# Patient Record
Sex: Female | Born: 1975 | Hispanic: No | Marital: Married | Smoking: Never smoker
Health system: Southern US, Community
[De-identification: ages and names within clinical notes are randomized; demographics above are authoritative.]

## PROBLEM LIST (undated history)

## (undated) DIAGNOSIS — I10 Essential (primary) hypertension: Secondary | ICD-10-CM

## (undated) DIAGNOSIS — N926 Irregular menstruation, unspecified: Secondary | ICD-10-CM

## (undated) DIAGNOSIS — E785 Hyperlipidemia, unspecified: Secondary | ICD-10-CM

## (undated) DIAGNOSIS — Z87898 Personal history of other specified conditions: Secondary | ICD-10-CM

## (undated) HISTORY — DX: Irregular menstruation, unspecified: N92.6

---

## 2009-12-25 ENCOUNTER — Encounter: Payer: Self-pay | Admitting: Family Medicine

## 2009-12-25 ENCOUNTER — Ambulatory Visit: Payer: Self-pay | Admitting: Family Medicine

## 2009-12-25 LAB — CONVERTED CEMR LAB
Antibody Screen: NEGATIVE
Basophils Absolute: 0 10*3/uL (ref 0.0–0.1)
Eosinophils Relative: 2 % (ref 0–5)
HCT: 34.3 % — ABNORMAL LOW (ref 36.0–46.0)
Lymphocytes Relative: 22 % (ref 12–46)
Neutro Abs: 8.6 10*3/uL — ABNORMAL HIGH (ref 1.7–7.7)
Neutrophils Relative %: 71 % (ref 43–77)
Platelets: 310 10*3/uL (ref 150–400)
RDW: 16 % — ABNORMAL HIGH (ref 11.5–15.5)
Rh Type: POSITIVE
Rubella: 345.3 intl units/mL — ABNORMAL HIGH
Sickle Cell Screen: NEGATIVE

## 2010-01-01 ENCOUNTER — Ambulatory Visit: Payer: Self-pay | Admitting: Family Medicine

## 2010-01-01 ENCOUNTER — Encounter: Payer: Self-pay | Admitting: Family Medicine

## 2010-01-04 ENCOUNTER — Encounter: Payer: Self-pay | Admitting: Family Medicine

## 2010-01-04 ENCOUNTER — Ambulatory Visit (HOSPITAL_COMMUNITY): Admission: RE | Admit: 2010-01-04 | Discharge: 2010-01-04 | Payer: Self-pay | Admitting: Family Medicine

## 2010-01-21 ENCOUNTER — Encounter: Payer: Self-pay | Admitting: Family Medicine

## 2010-01-21 DIAGNOSIS — E669 Obesity, unspecified: Secondary | ICD-10-CM

## 2010-01-21 DIAGNOSIS — O34219 Maternal care for unspecified type scar from previous cesarean delivery: Secondary | ICD-10-CM | POA: Insufficient documentation

## 2010-01-24 ENCOUNTER — Ambulatory Visit: Payer: Self-pay | Admitting: Family Medicine

## 2010-02-01 ENCOUNTER — Ambulatory Visit: Payer: Self-pay | Admitting: Family Medicine

## 2010-02-01 ENCOUNTER — Encounter: Payer: Self-pay | Admitting: Family Medicine

## 2010-02-05 ENCOUNTER — Encounter: Payer: Self-pay | Admitting: Family Medicine

## 2010-02-14 ENCOUNTER — Encounter (INDEPENDENT_AMBULATORY_CARE_PROVIDER_SITE_OTHER): Payer: Self-pay | Admitting: *Deleted

## 2010-02-14 ENCOUNTER — Encounter: Payer: Self-pay | Admitting: Family Medicine

## 2010-02-14 ENCOUNTER — Ambulatory Visit: Payer: Self-pay | Admitting: Family Medicine

## 2010-02-26 ENCOUNTER — Encounter: Payer: Self-pay | Admitting: Family Medicine

## 2010-02-26 ENCOUNTER — Ambulatory Visit: Payer: Self-pay | Admitting: Family Medicine

## 2010-02-26 LAB — CONVERTED CEMR LAB
Hemoglobin: 10.7 g/dL — ABNORMAL LOW (ref 12.0–15.0)
MCHC: 32.1 g/dL (ref 30.0–36.0)
Platelets: 245 10*3/uL (ref 150–400)
RDW: 16.3 % — ABNORMAL HIGH (ref 11.5–15.5)

## 2010-03-12 ENCOUNTER — Ambulatory Visit: Payer: Self-pay | Admitting: Family Medicine

## 2010-03-27 ENCOUNTER — Ambulatory Visit: Payer: Self-pay | Admitting: Family Medicine

## 2010-04-09 ENCOUNTER — Encounter: Payer: Self-pay | Admitting: Family Medicine

## 2010-04-09 ENCOUNTER — Ambulatory Visit: Payer: Self-pay | Admitting: Family Medicine

## 2010-04-09 LAB — CONVERTED CEMR LAB: GC Probe Amp, Genital: NEGATIVE

## 2010-04-11 ENCOUNTER — Encounter: Payer: Self-pay | Admitting: Family Medicine

## 2010-04-18 ENCOUNTER — Ambulatory Visit: Payer: Self-pay | Admitting: Family Medicine

## 2010-04-22 ENCOUNTER — Ambulatory Visit: Payer: Self-pay | Admitting: Family Medicine

## 2010-04-22 ENCOUNTER — Telehealth (INDEPENDENT_AMBULATORY_CARE_PROVIDER_SITE_OTHER): Payer: Self-pay | Admitting: *Deleted

## 2010-04-24 ENCOUNTER — Encounter: Payer: Self-pay | Admitting: Family Medicine

## 2010-05-03 ENCOUNTER — Ambulatory Visit: Payer: Self-pay | Admitting: Family Medicine

## 2010-05-07 ENCOUNTER — Ambulatory Visit: Payer: Self-pay | Admitting: Family Medicine

## 2010-05-10 ENCOUNTER — Encounter: Payer: Self-pay | Admitting: Family Medicine

## 2010-05-10 ENCOUNTER — Ambulatory Visit: Payer: Self-pay | Admitting: Obstetrics and Gynecology

## 2010-05-13 ENCOUNTER — Ambulatory Visit: Payer: Self-pay | Admitting: Family Medicine

## 2010-05-13 ENCOUNTER — Inpatient Hospital Stay (HOSPITAL_COMMUNITY): Admission: RE | Admit: 2010-05-13 | Discharge: 2010-05-16 | Payer: Self-pay | Admitting: Obstetrics & Gynecology

## 2010-05-13 ENCOUNTER — Ambulatory Visit: Payer: Self-pay | Admitting: Obstetrics and Gynecology

## 2010-05-23 ENCOUNTER — Encounter: Payer: Self-pay | Admitting: Emergency Medicine

## 2010-05-23 ENCOUNTER — Inpatient Hospital Stay (HOSPITAL_COMMUNITY): Admission: AD | Admit: 2010-05-23 | Discharge: 2010-05-27 | Payer: Self-pay | Admitting: Obstetrics and Gynecology

## 2010-05-23 ENCOUNTER — Ambulatory Visit: Payer: Self-pay | Admitting: Obstetrics and Gynecology

## 2010-06-05 ENCOUNTER — Ambulatory Visit: Payer: Self-pay | Admitting: Family Medicine

## 2010-06-05 DIAGNOSIS — O2390 Unspecified genitourinary tract infection in pregnancy, unspecified trimester: Secondary | ICD-10-CM | POA: Insufficient documentation

## 2010-06-05 DIAGNOSIS — O91019 Infection of nipple associated with pregnancy, unspecified trimester: Secondary | ICD-10-CM | POA: Insufficient documentation

## 2010-06-05 DIAGNOSIS — O91219 Nonpurulent mastitis associated with pregnancy, unspecified trimester: Secondary | ICD-10-CM | POA: Insufficient documentation

## 2010-06-05 DIAGNOSIS — N719 Inflammatory disease of uterus, unspecified: Secondary | ICD-10-CM | POA: Insufficient documentation

## 2010-06-26 ENCOUNTER — Ambulatory Visit: Payer: Self-pay | Admitting: Family Medicine

## 2010-06-26 ENCOUNTER — Encounter: Payer: Self-pay | Admitting: Family Medicine

## 2011-01-28 NOTE — Assessment & Plan Note (Signed)
Summary: IUD placement at Post Partum Visit   Vital Signs:  Patient profile:   35 year old female Weight:      242.1 pounds Temp:     97.7 degrees F oral Pulse rate:   71 / minute Pulse rhythm:   regular BP sitting:   114 / 76  (left arm) Cuff size:   large  Vitals Entered By: Loralee Pacas CMA (June 26, 2010 4:50 PM)  Primary Care Provider:  Jamie Brookes MD   History of Present Illness: Pregnancy complications: no Delivery type: VBAC Vaginal bleeding: resolved shortly after delivery with no further problems, endometritis after delivery, in hosptial for 6 days. See prior visit.  Menses: no menstration Contraception: IUD Vaginal discharge: none Abdominal pain: no Fever/Chills: no Feeding: breast feeding Sleep:she is sleeping about 6 hours a night Mood: normal  Return to school/work: homekeeping Bowel Movements: regular and normal   Pt also here for IUD placement. Consent signed. Dr. Jennette Kettle assisted.    Current Medications (verified): 1)  Prenatal Vitamins Otc .... Take One Daily 2)  Calna  Tabs (Prenatal Vitamins) .Marland Kitchen.. 1 By Mouth Daily 3)  Cleocin 150 Mg Caps (Clindamycin Hcl) .... Take 1 Pill Every 6 Hours For 2 Days. 4)  Cleocin 300 Mg Caps (Clindamycin Hcl) .... Take 1 Pill Every 6 Hours For 2 Days.  Allergies (verified): No Known Drug Allergies  Review of Systems        vitals reviewed and pertinent negatives and positives seen in HPI   Physical Exam  General:  Well-developed,well-nourished,in no acute distress; alert,appropriate and cooperative throughout examination Abdomen:  soft, obese, difficult to feel uterus Genitalia:  normal vagina and cervix.  Psych:  Cognition and judgment appear intact. Alert and cooperative with normal attention span and concentration. No apparent delusions, illusions, hallucinations   Impression & Recommendations:  Problem # 1:  CONTRACEPTIVE MANAGEMENT (ICD-V25.09) Assessment New Pt had IUD placed in uterus during exam.  Her cervix was slightly stenotic and since she has had endometritis (over 5 weeks ago) after discussing with Dr. Jennette Kettle it was decided to treat prophylaxically for 48 hours. Given Clindamycin as treatment. Pt tolerated procedure well. States that she felt the strings.   Orders: [facility] ([address]) Postpartum visit- [facility] ([address])  Problem # 2:  POSTPARTUM EXAMINATION (ICD-V24.2) Assessment: New Pt has a normal post partum exam. She is doing well physically and psychologically.   [facility]  Complete Medication List: 1)  Prenatal Vitamins Otc  .... Take one daily 2)  Calna Tabs (Prenatal vitamins) .Marland Kitchen.. 1 by mouth daily 3)  Cleocin 150 Mg Caps (Clindamycin hcl) .... Take 1 pill every 6 hours for 2 days. 4)  Cleocin 300 Mg Caps (Clindamycin hcl) .... Take 1 pill every 6 hours for 2 days.  Patient Instructions: 1)  Feel for your strings every month after your period.  2)  If you can not feel the strings at any time, make an appointment to be seen.  3)  You can resume sex in 1 week.  4)  You will need a pap smear in late January.  5)  Call if you have any other concerns.  Prescriptions: CLEOCIN 300 MG CAPS (CLINDAMYCIN HCL) take 1 pill every 6 hours for 2 days.  #8 x 0   Entered and Authorized by:   Jamie Brookes MD   Signed by:   Jamie Brookes MD on 06/26/2010   Method used:   Electronically to  [facility] [address] [phone]* (retail)       [address]       [place], [place]  [address]       Ph: [phone]       Fax: [phone]   RxID:   [id number] CLEOCIN 150 MG CAPS (CLINDAMYCIN HCL) take 1 pill every 6 hours for 2 days.  #8 x 0   Entered and Authorized by:   Jamie Brookes MD   Signed by:   Jamie Brookes MD on 06/26/2010   Method used:   Electronically to        [facility]. [phone]* (retail)       [address]       [place], [place]  [address]       Ph: [phone]       Fax:  [phone]   RxID:   [id number]    Laboratory Results   Urine Tests  Date/Time Received: June 26, 2010 4:53 PM  Date/Time Reported: June 26, 2010 5:10 PM     Urine HCG: negative Comments: ...............test performed by......Marland KitchenBonnie A. [place], MLS (ASCP)cm     Appended Document: IUD placement at Post Partum Visit

## 2011-01-28 NOTE — Letter (Signed)
Summary: Generic Letter  [facility]  [address]   [place], [place] [address]   Phone: [phone]  Fax: [phone]    02/14/2010  ELAINNA ESHLEMAN [address] [place], [place]  [address]   Dear Maricela Curet of Ms. Francene Finders,  Mrs. Losano is my obstetrics patient and due to her having a c-section in the past there is a chance that she will require another c-section. It would be greatly appreciated for you to join her as she prepares to deliver her 4th child and she will need help taking care of the many children in her household.           Sincerely,   Visual merchandiser MD  STATE OF [facility] OF _________________ On this__________day of ____________, personally appeared before me the named ______________, to me known and known to me to be the person described in and who executed the forgoing instrument and he/she acknowledges that he/she executed the same and being duly sworn by me, made oath that the statements in the foregoing instrument are true.  _______________ Notary Public   My Commission Expires:_______________ (OFFICIAL SEAL)

## 2011-01-28 NOTE — Miscellaneous (Signed)
Summary: Consent: IUD Insertion  Consent: IUD Insertion   Imported By: Knox Royalty 11/28/2010 10:48:12  _____________________________________________________________________  External Attachment:    Type:   Image     Comment:   External Document

## 2011-01-28 NOTE — Assessment & Plan Note (Signed)
Summary: 34.2 OB, [id number], EDD 05-06-10   Vital Signs:  Patient profile:   35 year old female Height:      56.75 inches Weight:      250.8 pounds BMI:     54.95 Temp:     97.8 degrees F oral Pulse rate:   81 / minute BP sitting:   108 / 71  (left arm) Cuff size:   large  Vitals Entered By: Gladstone Pih (March 27, 2010 4:26 PM) CC: OB 34  2/7 Is Patient Diabetic? No Pain Assessment Patient in pain? no        Primary Care Provider:  Jamie Brookes MD  CC:  OB 34  2/7.  History of Present Illness: Pt is doing well. She is not having any contractions, trace edema and is still eating well. She comes in with her brother who is her interpretor as usual.   Habits & Providers  Alcohol-Tobacco-Diet     Tobacco Status: never     Cigarette Packs/Day: n/a  Current Medications (verified): 1)  Prenatal Vitamins Otc .... Take One Daily  Allergies (verified): No Known Drug Allergies  Social History: Smoking Status:  never  Review of Systems        vitals reviewed and pertinent negatives and positives seen in HPI   Physical Exam  General:  Well-developed,well-nourished,in no acute distress; alert,appropriate and cooperative throughout examination Abdomen:  normal pregnant abdomen. Baby's head is seen as down on [place] today at bedside.    Impression & Recommendations:  Problem # 1:  PREGNANT STATE, INCIDENTAL (ICD-V22.2) Assessment Unchanged Pt is doing well. Plan for GBS testing at next visit. Will have meeting with Dr. Bailey Mech in April to talk about c-section/birth plans.   Orders: Other OB visit- [facility] ([facility]K)  Complete Medication List: 1)  Prenatal Vitamins Otc  .... Take one daily  Patient Instructions: 1)  Remember to do the kick counts if you don't feel the baby kicking. 2)  You are doing well. 3)  Keep walking and eating healthy. 4)  I hope your sister is able to come in time.  5)  I will see you in 2 weeks and we will test for GBS next visit.     Flowsheet  View for Follow-up Visit    Estimated weeks of       gestation:     34 2/7    Weight:     250.8    Blood pressure:   108 / 71    Headache:     few    Nausea/vomiting:   No    Edema:     1+LE    Vaginal bleeding:   no    Vaginal discharge:   no    Fundal height:      34    FHR:       136    Fetal activity:     yes    Labor symptoms:   no    Fetal position:     vertex    Taking prenatal vits?   Y    Smoking:     n/a    Next visit:     2 wk    Resident:     Clotilde Dieter    Preceptor:     Pearletha Forge

## 2011-01-28 NOTE — Assessment & Plan Note (Signed)
Summary: endometritis, left mastitis   Vital Signs:  Patient profile:   35 year old female Weight:      240.5 pounds Temp:     97.6 degrees F oral Pulse rate:   65 / minute Pulse rhythm:   regular BP sitting:   114 / 75  (left arm) Cuff size:   large  Vitals Entered By: Loralee Pacas CMA (June 05, 2010 8:56 AM) CC: ob post-partum hospitalization check.  Is Patient Diabetic? No   Primary Care Provider:  Jamie Brookes MD  CC:  ob post-partum hospitalization check. .  History of Present Illness: PT comes in today for a hospital follow up visit. She is accompanied by her husband. He is her inturpretor. She was discharged from the hosptial on a Friday and after going home she developed a post-partum infection. She began having chills at home despite having multiple blankets over her. She came to [facility] and was transferred to [facility] with endometritis and left sided breast mastitiswhere she spent 5 days on IV antibiotics and a total 6 (5/26-5/30) days in the hospital including ED visit and transfer. She is doing well now and has had a little bit of mastitis on the left side of the breast. She says it is producing less milk but is not painful anymore. The baby is sleeping better now and she is getting a little more rest. She still wants to have the IUD placed at the next visit. She is not having any lochia now. No abdominal pain.   Habits & Providers  Alcohol-Tobacco-Diet     Tobacco Status: never  Current Medications (verified): 1)  Prenatal Vitamins Otc .... Take One Daily 2)  Calna  Tabs (Prenatal Vitamins) .Marland Kitchen.. 1 By Mouth Daily  Allergies (verified): No Known Drug Allergies  Review of Systems        vitals reviewed and pertinent negatives and positives seen in HPI   Physical Exam  General:  Well-developed,well-nourished,in no acute distress; alert,appropriate and cooperative throughout examination Lungs:  Normal respiratory effort, chest expands  symmetrically. Lungs are clear to auscultation, no crackles or wheezes. Heart:  Normal rate and regular rhythm. S1 and S2 normal without gallop, murmur, click, rub or other extra sounds. Abdomen:  Bowel sounds positive,abdomen soft and non-tender without masses, organomegaly or hernias noted. fundus not felt to be enlarged.  Psych:  Cognition and judgment appear intact. Alert and cooperative with normal attention span and concentration. No apparent delusions, illusions, hallucinations   Impression & Recommendations:  Problem # 1:  ENDOMETRITIS (ICD-615.9) Assessment Improved Pt is doing much better. She has no fever, no pain and no more bleeding. She had 5 days of IV Abx and is completely better.   Orders: [facility]- Est Level  3 ([address])  Problem # 2:  MASTITIS, LACTATING (ICD-675.20) Assessment: Improved Pt has less milk supply on the left breast but it is not painful anymore. Her mastitis has resolved.   Orders: [facility]- Est Level  3 ([address])  Complete Medication List: 1)  Prenatal Vitamins Otc  .... Take one daily 2)  Calna Tabs (Prenatal vitamins) .Marland Kitchen.. 1 by mouth daily  Patient Instructions: 1)  Come on June 29th at the 4:30 slot (ask the front desk to make this appointment: ok to double book).  2)  Take a Ibuprofen 30 mintues before your appointment to prevent cramps.     Flowsheet View for Follow-up Visit    Weight:     240.5    Blood  pressure:   114 / 75

## 2011-01-28 NOTE — Assessment & Plan Note (Signed)
Summary: OB 37 weeks   Vital Signs:  Patient profile:   35 year old female Weight:      257 pounds Pulse rate:   77 / minute BP sitting:   128 / 76  Vitals Entered By: Jone Baseman CMA (April 18, 2010 8:36 AM)  Habits & Providers  Alcohol-Tobacco-Diet     Cigarette Packs/Day: n/a  Allergies: No Known Drug Allergies   Impression & Recommendations:  Problem # 1:  PREGNANT STATE, INCIDENTAL (ICD-V22.2)  Doing well. Pain consistent with ligament laxity of pregnancy.  Tylenol ok to take.  Limited support now and when baby comes.  Reviewed kick counts and labor precautions.  Refill PNV.  Orders: Other OB visit- [facility] ([facility]K)  Problem # 2:  PREVIOUS C-SECT DELIVERY ANTPRTM COND/COMP (ZOX-096.04) Pt initially wanted repeat section, but realized the recovery while caring for 7 children will be hard. I  discussed with Dr. Jolayne Panther, who requests that pt be seen again for counseling.  Appt for 4/26 at 10:30.    Complete Medication List: 1)  Prenatal Vitamins Otc  .... Take one daily 2)  Calna Tabs (Prenatal vitamins) .Marland Kitchen.. 1 by mouth daily  Patient Instructions: 1)  You may take Tylenol (acetaminophen) 500 mg every 6 hours as needed for pain. 2)  Please go to [facility] or call us if you have regular contractions, watery discharge, if the baby is not moving well, or if you have vaginal bleeding. 3)  We will call the OB doctors and see if we can change things to try for a vaginal delivery.  Prescriptions: CALNA  TABS (PRENATAL VITAMINS) 1 by mouth daily  #100 x 3   Entered and Authorized by:   Sarah Swaziland MD   Signed by:   Sarah Swaziland MD on 04/18/2010   Method used:   Electronically to        [facility] [address] [phone]* (retail)       [address] [place], [place]  [address]       Ph: [phone]       Fax: [phone]   RxID:   [id number] PRENATAL VITAMINS OTC take one daily  #0 x 0   Entered and Authorized by:   Sarah [place]aziland MD   Signed by:    Sarah Swaziland MD on 04/18/2010   Method used:   Print then Give to Patient   RxID:   [id number]   Prenatal Visit Concerns noted: Would like to try for vaginal delivery.  Also with lots of pain in bottom, goes around to back.  Not like contractions.  Worse with walking, turning over. No meds for it.  Pt takes care of her 3 other children and her brother-in-law's 3 children (ages 68, 4.5,3.5).  Husband goes to work part-time.  Limited support for when baby comes.  Hope to have sister come from [place], but she still has not been given an appt by the embassy to apply for visa, so unclear if she will be able to come.  Husband helps in morning.     Flowsheet View for Follow-up Visit    Estimated weeks of       gestation:     37 3/7    Weight:     257    Blood pressure:   128 / 76    Headache:     few    Nausea/vomiting:   No    Edema:     TrLE  Vaginal bleeding:   no    Vaginal discharge:   d/c    Fundal height:      37    FHR:       140s    Fetal activity:     yes    Labor symptoms:   no    Fetal position:     vertex    Taking prenatal vits?   Y    Smoking:     n/a    Next visit:     1 wk

## 2011-01-28 NOTE — Letter (Signed)
Summary: Generic Letter: Letter to sister  [facility]  [address]   [place], [place] [address]   Phone: [phone]  Fax: [phone]    02/01/2010  AMARRA SAWYER [address] [place], [place]  [address]  Dear Maricela Curet of Ms. Francene Finders,  Mrs. Armstead is my obstetrics patient and due to her having a c-section in the past there is a chance that she will require another c-section. It would be greatly appreciated for you to join her as she prepares to deliver her 4th child.    Sincerely,   Jamie Brookes MD

## 2011-01-28 NOTE — Assessment & Plan Note (Signed)
Summary: 32.1 weeks,    Vital Signs:  Patient profile:   35 year old female Height:      56.75 inches Weight:      246.7 pounds Temp:     98.7 degrees F oral Pulse rate:   83 / minute BP sitting:   114 / 73  (left arm) Cuff size:   large  Vitals Entered By: Gladstone Pih (March 12, 2010 2:30 PM) CC: OB 32.1 wks, normal labs,  Is Patient Diabetic? No Pain Assessment Patient in pain? no        Primary Care Provider:  Jamie Brookes MD  CC:  OB 32.1 wks, normal labs, and .  History of Present Illness: Pt is doing well, she had one episode of sharp belly pain but it only lasted minutes and she has not had any more. She had no blood or fluid leakage. The baby is moving a lot. She is having some swelling in her lower legs that often goes away by morning but sometimes is still barely there.   Habits & Providers  Alcohol-Tobacco-Diet     Cigarette Packs/Day: n/a  Current Medications (verified): 1)  Prenatal Vitamins Otc .... Take One Daily  Allergies (verified): No Known Drug Allergies  Social History: lives with her husband and her brother, takes care of her own 3 children and her brothers 3 children.  Brother is interpretor  Review of Systems        vitals reviewed and pertinent negatives and positives seen in HPI   Physical Exam  General:  Well-developed,well-nourished,in no acute distress; alert,appropriate and cooperative throughout examination Extremities:  trace edema on feet   Impression & Recommendations:  Problem # 1:  PREGNANT STATE, INCIDENTAL (ICD-V22.2)  Pt is doing well. SHe has an appointment set up with Dr. Ladean Raya in April. Plan to see her back in 2 weeks. GBS at 36 weeks.  Orders: Other OB visit- [facility] ([facility]BCK)  Complete Medication List: 1)  Prenatal Vitamins Otc  .... Take one daily  Patient Instructions: 1)  It was nice to see you today.  2)  You are 32.[redacted] weeks along.  3)  If you have any problems with bleeding, contractions or fluid  [place] call the doctor line or go to the [facility]. 4)  If you don't feel the baby kicking for a while during the day, lay down for an hour and count the baby kicks. There should be 5 kicks in an hour. If you don't have 5 kicks in 1 hour drink a glass of cold water and lay down again and keep counting. You should have 10 kicks in 2 hours. If not, go to [facility] hospital.  5)  I will see you in 2 weeks.     Flowsheet View for Follow-up Visit    Estimated weeks of       gestation:     32 1/7    Weight:     246.7    Blood pressure:   114 / 73    Headache:     few    Nausea/vomiting:   nausea    Edema:     TrLE    Vaginal bleeding:   no    Vaginal discharge:   no    Fundal height:      32    FHR:       1050's    Fetal activity:     yes    Labor symptoms:   few ctx  Fetal position:     ??    Taking prenatal vits?   Y    Smoking:     n/a    Next visit:     2 wk    Resident:     Clotilde Dieter    Preceptor:     Leveda Anna

## 2011-01-28 NOTE — Assessment & Plan Note (Signed)
Summary: 40.1 OB/KH   Vital Signs:  Patient profile:   35 year old female Weight:      259.4 pounds Temp:     97.4 degrees F oral Pulse rate:   75 / minute Pulse rhythm:   regular BP sitting:   122 / 77  (left arm) Cuff size:   large  Vitals Entered By: Loralee Pacas CMA (May 07, 2010 9:16 AM)  Primary Care Provider:  Jamie Brookes MD  CC:  40.1 OB.  History of Present Illness: PT is doing well.   Habits & Providers  Alcohol-Tobacco-Diet     Cigarette Packs/Day: n/a  Allergies: No Known Drug Allergies   Impression & Recommendations:  Problem # 1:  PREGNANT STATE, INCIDENTAL (ICD-V22.2) Assessment Unchanged Pt is 40.1. She has an NST/BPP set up for this Friday, then induction is set up for Monday if she does not go into labor before that time. Pt has my pager number. I will see them at the induction if not before.   Orders: Other OB visit- [facility] (OBCK)  Complete Medication List: 1)  Prenatal Vitamins Otc  .... Take one daily 2)  Calna Tabs (Prenatal vitamins) .Marland Kitchen.. 1 by mouth daily    Flowsheet View for Follow-up Visit    Estimated weeks of       gestation:     40 1/7    Weight:     259.4    Blood pressure:   122 / 77    Headache:     few    Nausea/vomiting:   No    Edema:     1+LE    Vaginal bleeding:   no    Vaginal discharge:   d/c    Fundal height:      40    FHR:       145    Fetal activity:     yes    Labor symptoms:   no    Fetal position:     vertex    Taking prenatal vits?   Y    Smoking:     n/a    Next visit:     see you at induction if not before    Resident:     Licensed conveyancer:     [place]

## 2011-01-28 NOTE — Assessment & Plan Note (Signed)
Summary: 39.4, G3P3013, GBS neg, EDD 05-06-10   Vital Signs:  Patient profile:   35 year old female Weight:      259.6 pounds Temp:     98.3 degrees F oral Pulse rate:   72 / minute Pulse rhythm:   regular BP sitting:   125 / 78  (left arm) Cuff size:   large  Vitals Entered By: Loralee Pacas CMA (May 03, 2010 9:04 AM) CC: 39.4 OB   Primary Care Provider:  Jamie Brookes MD  CC:  39.4 OB.  History of Present Illness: Pt has gone over to [facility] and signed VBAC papers. She comes with her husband and kids today. They take their children to Dr. Netta Cedars at [facility]. This baby will f/u with him as well. We will set her up for an NST/BPP, appointment with me and induction for 1 week from her due date.   Habits & Providers  Alcohol-Tobacco-Diet     Cigarette Packs/Day: n/a  Current Medications (verified): 1)  Prenatal Vitamins Otc .... Take One Daily 2)  Calna  Tabs (Prenatal Vitamins) .Marland Kitchen.. 1 By Mouth Daily  Allergies (verified): No Known Drug Allergies  Review of Systems        vitals reviewed and pertinent negatives and positives seen in HPI    Impression & Recommendations:  Problem # 1:  PREGNANT STATE, INCIDENTAL (ICD-V22.2) Assessment Unchanged Pt wants a VBAC. She will have 7 children to watch after and will need to get back home. Does not want to spend 3 days in the hosptial.  Next appointment with me Tuesday, May 10th at 9:00am NST/BPP next Friday, May 13th at 10:00 am Induction for Monday, May 16th at 7:30 pm   Orders: Other OB visit- [facility] (OBCK)  Complete Medication List: 1)  Prenatal Vitamins Otc  .... Take one daily 2)  Calna Tabs (Prenatal vitamins) .Marland Kitchen.. 1 by mouth daily  Patient Instructions: 1)  pt given paper with dates of appointments set up for her.     Flowsheet View for Follow-up Visit    Estimated weeks of       gestation:     39 4/7    Weight:     259.6    Blood pressure:   125 / 78    Headache:     few    Nausea/vomiting:   No  Edema:     TrLE    Vaginal bleeding:   no    Vaginal discharge:   d/c    Fundal height:      38    FHR:       143    Fetal activity:     yes    Labor symptoms:   no    Fetal position:     vertex    Taking prenatal vits?   Y    Smoking:     n/a    Next visit:     1 wk    Resident:     Jw Covin    Preceptor:     breen

## 2011-01-28 NOTE — Assessment & Plan Note (Signed)
Summary: NOB/AAM/DSL   Vital Signs:  Patient profile:   35 year old female LMP:     07/30/2009 Height:      56.75 inches Weight:      235 pounds BMI:     51.49 BSA:     1.92 Pulse rate:   81 / minute BP sitting:   119 / 72  Vitals Entered By: Jone Baseman CMA (January 01, 2010 1:58 PM) CC: NOB LMP (date): 07/30/2009 EDC by LMP==> 05/06/2010 EDC 05/06/2010 LMP - Character: normal LMP - Reliable? Yes Menarche (age onset years): 12   Menses interval (days): 28 Menstrual flow (days): 4 On BCP's at conception: no Date of + home preg. test: 09/02/2009 Enter LMP: 07/30/2009   CC:  NOB.  Habits & Providers  Alcohol-Tobacco-Diet     Cigarette Packs/Day: brother smokes outside  Social History: Education:  highschool Hepatitis Risk:  no Packs/Day:  brother smokes outside   Impression & Recommendations:  Problem # 1:  PREGNANT STATE, INCIDENTAL (ICD-V22.2) Assessment New This is the patients first OB visit. We set up first [place] for this friday At 1:45pm. She will need 1 hr GTT at next visit. I have referred her to [facility] clinic to discuss VBAC.  She is 22.1 wks, [id number], EDD 05-06-10  Orders: Prenatal U/S > 14 weeks - 48185 (Prenatal U/S) GC/Chlamydia-[facility] (87591/87491) Obstetric Referral (Obstetric) Other OB visit- [facility] (OBCK)  Other Orders: Pap Smear-[facility] ([id number])  Patient Instructions: 1)  You have an ultrasound scheduled for 1:45 pm this Friday. 2)  You are doing well. The babies heart rate in 140 3)  At your next visit you will have to stay an hour to have a glucose test.  4)  We will set up an appointment for you to meet with the doctors at [facility] to talk to them about a vaginal birth or another C-section.  5)  Make an appointment to see me in 1 month.   Prenatal Visit    FOB name: Eliane Decree [facility] Confirmation:    New working [facility]: 05/06/2010    LMP reliable? Yes    Last menses onset (LMP) date: 07/30/2009    EDC by LMP:  05/06/2010   Flowsheet View for Follow-up Visit    Estimated weeks of       gestation:     22 1/7    Weight:     235    Blood pressure:   119 / 72    Headache:     No    Nausea/vomiting:   No    Edema:     0    Vaginal bleeding:   no    Vaginal discharge:   no    Fundal height:      18    FHR:       140    Fetal activity:     yes    Labor symptoms:   no    Fetal position:     ??    Taking prenatal vits?   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 : Yes    Menarche: 12 years    Menses interval: 28 days    Menstrual flow 4 days    On BCP's at conception: no    Date of positive (+) home preg. test: 09/02/2009    Pre Pregnancy Weight: 235 lbs.    Symptoms since LMP: amenorrhea, fatigue, irritability, bloating, tender breasts, urinary frequency  Prenatal Visit    FOB name: Eliane Decree [facility] Confirmation:    New working [facility]: 05/06/2010    LMP reliable? Yes    Last menses onset (LMP) date: 07/30/2009    EDC by LMP: 05/06/2010   Past Pregnancy History    Gravida:     4    Term Births:     3    Premature Births:   0    Living Children:   3    Para:       3    Mult. Births:     1    Prev C-Section:   1    Aborta:     1    Elect. Ab:     0     Spont. Ab:     1    Ectopics:     0  Pregnancy # 1    Delivery date:     10/13/2006    Weeks Gestation:   40    Preterm labor:     no    Delivery type:     NSVD    Hours of labor:     9    Anesthesia type:     epidural    Delivery location:     [place]    Infant Sex:     Female    Birth weight:     6 lbs    Name:     Arees  Pregnancy # 2    Delivery date:     12/08/2007    Weeks Gestation:   37    Preterm labor:     no    Delivery type:     C-section    Hours of labor:     1    Anesthesia type:     spinal    Delivery location:     [place]    Infant Sex:     Female    Name:     Ninfa Linden    Comments:     twins #1 5lb #2  5lb 10 oz  Pregnancy # 3    Delivery date:     03/29/2005    Comments:     spontaneous Ab at 8 wks  Pregnancy # 4    Comments:     current   Genetic History     Thalassemia:     mother: no    Neural tube defect:   mother: no    Down's Syndrome:  mother: no    Tay-Sachs:     mother: no    Sickle Cell Dz/Trait:   mother: no    Hemophilia:     mother: no    Muscular Dystrophy:   mother: no    Cystic Fibrosis:   mother: no    Huntington's Dz:   mother: no    Mental Retardation:   mother: no    Fragile X:     mother: no    Other Genetic or       Chromosomal Dz:   mother: yes       comments: brother had ear defect    Child with other       birth defect:     mother: no    > 3 spont. abortions:   mother: no    Hx of stillbirth:     mother: yes  Infection Risk History    High Risk Hepatitis B: no    Immunized against Hepatitis B: no    Exposure to TB: no    Patient with history of Genital Herpes: no    Sexual partner with history of Genital Herpes: no    History of STD (GC, Chlamydia, Syphilis, HPV): no    Rash, Viral, or Febrile Illness since LMP: no    Exposure to Cat Litter: no    Chicken Pox Immune Status: Hx of Disease: Immune    History of Parvovirus (Fifth Disease): no    Occupational Exposure to Children: none  Environmental Exposures     Xray Exposure since LMP: no    Chemical or other exposure: no    Medication, drug, or alcohol use since LMP: no   Flowsheet View for Follow-up Visit    Estimated weeks of       gestation:     22 1/7    Weight:     235    Blood pressure:   119 / 72    Hx headache?     No    Nausea/vomiting?   No    Edema?     0    Bleeding?     no    Leakage/discharge?   no    Fetal activity:       yes    Labor symptoms?   no    Fundal height:      18    FHR:       140    Fetal position:      ??    Taking Vitamins?   Y    Smoking PPD:   brother smokes outside    Comment:     anatomy US ordered    Next visit:     4 wk    Resident:     Clotilde Dieter    Preceptor:     Chambliss      Appended Document: NOB/AAM/DSL Pt needs 1 hour GTT before next visit.  Will clarify "h/o stillbirth" in genetics screen - likely refers to 1st trimester SAB in past.  Due for flu shot.

## 2011-01-28 NOTE — Miscellaneous (Signed)
Summary: glucola order  Clinical Lists Changes  Problems: Added new problem of OBESITY, UNSPECIFIED (ICD-278.00) Added new problem of PREVIOUS C-SECT DELIVERY ANTPRTM COND/COMP (BJY-782.95) Orders: Added new Test order of Glucose 1 hr-[facility] (62130) - Signed

## 2011-01-28 NOTE — Consult Note (Signed)
Summary: [facility]   Imported By: De Nurse 06/05/2010 10:46:39  _____________________________________________________________________  External Attachment:    Type:   Image     Comment:   External Document

## 2011-01-28 NOTE — Assessment & Plan Note (Signed)
Summary:  28.3  OB, EDD 05-06-10, female fetus, normal 1 hr GTT   Vital Signs:  Patient profile:   35 year old female Height:      56.75 inches Weight:      238.9 pounds BMI:     52.34 Pulse rate:   87 / minute BP sitting:   107 / 69  (left arm) Cuff size:   regular  Vitals Entered By: Gladstone Pih (February 14, 2010 10:26 AM) CC: OB 28  3/7 Is Patient Diabetic? No Pain Assessment Patient in pain? no        Primary Care Provider:  Jamie Brookes MD  CC:  OB 28  3/7.  History of Present Illness: Pt comes in with her brother who translates for her. They are from [place]. Pt is 28.3 today and is doing well. She is having a 1 hr GTT today. We will set up a visit with [facility] to discuss VBAC at next visit.   Habits & Providers  Alcohol-Tobacco-Diet     Cigarette Packs/Day: n/a  Allergies: No Known Drug Allergies  Social History: lives with her husband and her brother, takes care of her own 3 children and her brothers 3 children.   Review of Systems        vitals reviewed and pertinent negatives and positives seen in HPI   Physical Exam  General:  Well-developed,well-nourished,in no acute distress; alert,appropriate and cooperative throughout examination Abdomen:  Bowel sounds positive,abdomen soft and non-tender pregnant abdomen   Impression & Recommendations:  Problem # 1:  PREGNANT STATE, INCIDENTAL (ICD-V22.2) Assessment Unchanged Pt is doing well today. This will be her 4th child. She had a normal 1 hr GTT today. Will plan to set up VBAC discussion with OB surgeons a next appt. RTC in 2 weeks.   Orders: Glucose 1 hr-[facility] ([id number]) Other OB visit- [facility] (OBCK)  Patient Instructions: 1)  I will see you in 2 weeks.  2)  The baby is doing well.     Flowsheet View for Follow-up Visit    Estimated weeks of       gestation:     28 3/7    Weight:     238.9    Blood pressure:   107 / 69    Headache:     few    Nausea/vomiting:   No    Edema:     0    Vaginal  bleeding:   no    Vaginal discharge:   no    Fundal height:      28    FHR:       150's    Fetal activity:     yes    Labor symptoms:   no    Fetal position:     ??    Taking prenatal vits?   Y    Smoking:     n/a    Next visit:     2 wk    Resident:     Clotilde Dieter

## 2011-01-28 NOTE — Miscellaneous (Signed)
Summary: Re : C Section scheduled for 04-29-10 @ 1pm  Clinical Lists Changes   received notice  from  [facility] that patient has been scheduled for C section for 04/29/2010. patient is aware  of date according to brother who translates for her.Theresia Lo RN  April 11, 2010 4:50 PM   I need to know the time for the c-section so I can try to attend. She is my continuity delivery. Jamie Brookes MD  April 12, 2010 7:39 AM   the paper we received states 1:00 PM on 04/29/2010 Theresia Lo RN  April 12, 2010 8:37 AM

## 2011-01-28 NOTE — Assessment & Plan Note (Signed)
Summary: 38.0 wks  OB/KH   Vital Signs:  Patient profile:   35 year old female Weight:      255.9 pounds Temp:     98.1 degrees F oral Pulse rate:   73 / minute Pulse rhythm:   regular BP sitting:   112 / 75  (left arm) Cuff size:   large  Vitals Entered By: Loralee Pacas CMA (April 22, 2010 9:12 AM)  Primary Care Provider:  Jamie Brookes MD   History of Present Illness: Pt has decided to do a VBAC instead of a c-section because she takes care of 6 (soon to be 7) children at home and can not plan to be down for the recovery time it takes for c-section. She has had he counseling with Dr. Ladean Raya and can quote he 1% risk of rupture statistic. Plan to have patient sign VBAC consent either here or at the [facility].   Habits & Providers  Alcohol-Tobacco-Diet     Cigarette Packs/Day: n/a  Current Medications (verified): 1)  Prenatal Vitamins Otc .... Take One Daily 2)  Calna  Tabs (Prenatal Vitamins) .Marland Kitchen.. 1 By Mouth Daily  Allergies (verified): No Known Drug Allergies  Family History: comes from large family (3 brothers, 7 sisters), from [place], [place]  Review of Systems        vitals reviewed and pertinent negatives and positives seen in HPI    Impression & Recommendations:  Problem # 1:  PREGNANT STATE, INCIDENTAL (ICD-V22.2) Pt has decided she wants a VBAC. She needs to sign consent papers to have a VBAC. She has already had the counseling she needs. PT decided to change it because she has too many children to take care of at hom and does not have time to be out for recovery from a c-section.   Orders: Other OB visit- [facility] (OBCK)  Complete Medication List: 1)  Prenatal Vitamins Otc  .... Take one daily 2)  Calna Tabs (Prenatal vitamins) .Marland Kitchen.. 1 by mouth daily  Patient Instructions: 1)  It was good to see you today.  2)  I will call you later about signing the consent form.  3)  If you are having abdominal pain or contractions, bleeding or a leakage of fluid go to  [facility].     [facility] for Follow-up Visit    Estimated weeks of       gestation:     38 0/7    Weight:     255.9    Blood pressure:   112 / 75    Headache:     few    Nausea/vomiting:   No    Edema:     TrLE    Vaginal bleeding:   no    Vaginal discharge:   d/c    Fundal height:      38    FHR:       140's    Fetal activity:     yes    Labor symptoms:   no    Fetal position:     vertex    Taking prenatal vits?   Y    Smoking:     n/a    Next visit:     1 wk    Resident:     Avary Eichenberger    Preceptor:     Jennette Kettle

## 2011-01-28 NOTE — Progress Notes (Signed)
Summary: Going to [facility] to sign VBAC papers  ---- Converted from flag ---- ---- 04/18/2010 10:02 AM, Sarah [place] MD wrote: Could you call pt and let her know that [facility] wanted to see her again to talk about the trial of labor?  She has an appt on Tuesday, April 26 at 10:30.  Thanks! SJ ------------------------------  Pt husband contacted. He can take her at that time.  Rescheduled for Wed 4/27 at 9:00 to sign paper for VBAC. Husband notified and understands. Starleen Blue RN 04/22/2010

## 2011-01-28 NOTE — Assessment & Plan Note (Signed)
Summary: 36.1 wk, OB, GBS done, send handout when GBS back   Vital Signs:  Patient profile:   35 year old female Height:      56.75 inches Weight:      252.5 pounds BMI:     55.32 Temp:     98.1 degrees F oral Pulse rate:   90 / minute BP sitting:   116 / 74  (left arm) Cuff size:   large  Vitals Entered By: Gladstone Pih (April 09, 2010 4:26 PM) CC: OB 36 1/7 Is Patient Diabetic? No Pain Assessment Patient in pain? no        Primary Care Provider:  Jamie Brookes MD  CC:  OB 36 1/7.  History of Present Illness: Pt comes in today with her husband and 3 children. She is doing well but feels like the baby has dropped. She is having some occasional pain in her pelvic region. She is not having any signs of labor.   Habits & Providers  Alcohol-Tobacco-Diet     Tobacco Status: never     Cigarette Packs/Day: n/a  Current Medications (verified): 1)  Prenatal Vitamins Otc .... Take One Daily  Allergies (verified): No Known Drug Allergies  Social History: lives with her husband and her brother, takes care of her own 3 children and her brothers 3 children.  Brother is interpretor but husband speaks english as well. Husband does not currently have a job so brother pays for most everything.   Review of Systems        vitals reviewed and pertinent negatives and positives seen in HPI   Physical Exam  General:  Well-developed,well-nourished,in no acute distress; alert,appropriate and cooperative throughout examination Genitalia:  Normal introitus for age, no external lesions, no vaginal discharge, mucosa pink and moist, no vaginal or cervical lesions, no vaginal atrophy, no friaility or hemorrhage, normal uterus size and position, no adnexal masses or tenderness Psych:  Cognition and judgment appear intact. Alert and cooperative with normal attention span and concentration. No apparent delusions, illusions, hallucinations   Impression & Recommendations:  Problem # 1:   PREGNANT STATE, INCIDENTAL (ICD-V22.2) Pt is doing well. She was advised of precautions of when to come to the [facility]. She has an appointment with an OB MD tomorrow at the [facility]. She missed her appointment today. She will discuss options with them since she has had a c-section in the past.  Pt given my pager number to call me if she is getting admitted for labor. She will be seen in the [facility] OB clinic next week and then seen by me weekly until the baby is born. Will f/u on GBS and send report to [facility] when GBS is back.   Orders: GC/Chlamydia-[facility] ([id number]) Grp B Probe-[facility] ([id number]) Other OB visit- [facility] (OBCK)  Complete Medication List: 1)  Prenatal Vitamins Otc  .... Take one daily  Patient Instructions: 1)  You are doing really well. 2)  Remember if you are having contractions every 5 minutes, bleeding, rush of fluid, or pain go to [facility]. 3)  If you are getting admitted for labor have them page me at [phone].  4)  Please make an appointment with our OB clinic (to see one of our head doctors) and an appointment to see me every week from now until you have the baby.     Flowsheet View for Follow-up Visit    Estimated weeks of       gestation:     36 1/7  Weight:     252.5    Blood pressure:   116 / 74    Headache:     few    Nausea/vomiting:   No    Edema:     0    Vaginal bleeding:   no    Vaginal discharge:   d/c    Fundal height:      35    FHR:       145    Fetal activity:     yes    Labor symptoms:   no    Fetal position:     vertex    Taking prenatal vits?   Y    Smoking:     n/a    Next visit:     1 wk    Resident:     Clotilde Dieter    Preceptor:     chambliss

## 2011-01-28 NOTE — Miscellaneous (Signed)
Summary: C section?   Call [facility] at 36 wks to schedule appt.  Clinical Lists Changes [place] from [facility] called [phone]) asking [place] to call her about this pt's repeat C section. I called & left a message for her to call back.Golden Circle RN  February 05, 2010 11:08 AM   they rec'd a fax from [facility] asking about a consultation with a md there about a repeat C section.[place] states we do not need to fax anything, just call her at [phone]. they usually will schedule this when pt is at 36 weeks. asked that we call them when she is this far along .Marland KitchenGolden Circle RN  February 05, 2010 11:37 AM   spoke with [place] and she states she cannot schedule at this time , just plan to call back when patient is 36 weeks and they will be able to get her in within the week. will notifiy MD. Theresia Lo RN  February 05, 2010 1:53 PM   noted. Thank you. Jamie Brookes MD  February 07, 2010 11:44 AM

## 2011-01-28 NOTE — Assessment & Plan Note (Signed)
Summary: 30.1 wks ob, check position with portable US    Vital Signs:  Patient profile:   35 year old female Weight:      246.8 pounds Temp:     97.7 degrees F Pulse rate:   88 / minute BP sitting:   120 / 74  Vitals Entered By: Loralee Pacas CMA (February 26, 2010 2:06 PM)  Primary Care Provider:  Jamie Brookes MD  CC:  30.1 wk OB.  History of Present Illness: comes in today with her brother who interprets for her. She is having some occasional headaches and low back pain. She has been reluctant to take tylenol. Encouraged it's use. Pt is having a little swelling in her feet. She says it is not bad today.   Habits & Providers  Alcohol-Tobacco-Diet     Cigarette Packs/Day: n/a  Current Medications (verified): 1)  None  Allergies (verified): No Known Drug Allergies  Family History: comes from large family (3 brothers, 7 sisters)  Review of Systems        vitals reviewed and pertinent negatives and positives seen in HPI   Physical Exam  Extremities:  trace left pedal edema.     Impression & Recommendations:  Problem # 1:  PREGNANT STATE, INCIDENTAL (ICD-V22.2) Assessment Unchanged Pt is doing well. Discussed where she is going to send the baby to healthcare. She takes her other kids to [facility] (see Dr. Hyacinth Meeker) she plans to take this baby there.  Some swelling. BP 120/74. Will monitor. Unknown position, will check with bedside US next time to check position.  Orders: CBC-[facility] ([id number]) HIV-[facility] ([id number]) RPR-[facility] ([id number]) Other OB visit- [facility])  Patient Instructions: 1)  You are doing well. 2)  Use the Tylenol or warm towels to treat your back pain and headaches.  3)  You can prop your feet up at night to keep the swelling down. 4)  Your appointment to meet with the Ob surgeons at [facility] clinic is April 12 at 10 am.  5)  You will have some required labs done today. We can discuss them at your next visit in 2 week.      Flowsheet  View for Follow-up Visit    Estimated weeks of       gestation:     30 1/7    Weight:     246.8    Blood pressure:   120 / 74    Hx headache?     few    Nausea/vomiting?   No    Edema?     TrLE    Bleeding?     no    Leakage/discharge?   d/c    Fetal activity:       yes    Labor symptoms?   few ctx    Fundal height:      31    FHR:       140    Fetal position:      ??    Taking Vitamins?   Y    Smoking PPD:   n/a    Comment:     large body habitus: difficult to tell position    Next visit:     2 wk    Resident:     Clotilde Dieter    Preceptor:     Deirdre Priest

## 2011-01-28 NOTE — Assessment & Plan Note (Signed)
Summary: 26.4 wks ob/ EDD 05-06-10, Female fetus, needs 28 wks labs next vi   Vital Signs:  Patient profile:   35 year old female Weight:      240 pounds BP sitting:   110 / 66  Vitals Entered By: Arlyss Repress CMA, (February 01, 2010 9:46 AM)  Primary Care Provider:  Jamie Brookes MD  CC:  26.4 wk OB visit.  History of Present Illness: Pt is doing well. She comes in with her husband who acts as her interpretor.   Habits & Providers  Alcohol-Tobacco-Diet     Cigarette Packs/Day: n/a  Current Medications (verified): 1)  None  Allergies (verified): No Known Drug Allergies  Social History: Packs/Day:  n/a  Physical Exam  General:  Well-developed,well-nourished,in no acute distress; alert,appropriate and cooperative throughout examination Lungs:  Normal respiratory effort, chest expands symmetrically. Lungs are clear to auscultation, no crackles or wheezes. Heart:  Normal rate and regular rhythm. S1 and S2 normal without gallop, murmur, click, rub or other extra sounds.   Impression & Recommendations:  Problem # 1:  PREGNANT STATE, INCIDENTAL (ICD-V22.2) Assessment Unchanged Pt is doing well. Has occaional headaches. Pt advised to use Tylenol for headaches. Discussed birth control options with her. She will think about them. EDD 05-06-10. Pt will come back in 2 weeks and will get 28 wk labs at that time. Pt recieved flu shot today.  Pt still has not seen [facility]'s about VBAC. Resent flag as this was ordered on 01-01-10. Gave pt info about calling [facility] and talking to [employer] about their bill.   Orders: Other OB visit- [facility])  Other Orders: Obstetric Referral (Obstetric) Influenza Vaccine NON MCR ([address])  Patient Instructions: 1)  Talk to [employer] and [facility] about your hosptial bill to see what you are responsible for.  2)  Make an appointment for 2 weeks. You will get blood work done at that visit. 3)  You are 26 weeks and 4 days today.    4)  You got a flu shot today.    OB Initial Intake Information    Positive HCG by: self    Race: Other    Marital status: Married    Occupation: homemaker    Education (last grade completed): highschool    Number of children at home: 3    [facility]    Newborn's physician: Kemara Quigley [phone]  FOB Information    Husband/Father of baby: Eliane Decree    FOB occupation unemployed    Phone: [phone]    FOB Comments: Call Brother First:  Brother [phone]  Menstrual History    LMP (date): 07/30/2009    LMP - Character: normal    Menarche: 12 years    Menses interval: 28 days    Menstrual flow 4 days    On BCP's at conception: no    Date of positive (+) home preg. test: 09/02/2009   Flowsheet View for Follow-up Visit    Estimated weeks of       gestation:     26 4/7    Weight:     240    Blood pressure:   110 / 66    Headache:     No    Nausea/vomiting:   nausea    Edema:     0    Vaginal bleeding:   no    Vaginal discharge:   no    Fundal height:  27    FHR:       140's    Fetal activity:     yes    Labor symptoms:   no    Fetal position:     ??    Taking prenatal vits?   Y    Smoking:     n/a    Next visit:     2 wk    Resident:     Clotilde Dieter    Preceptor:     Sheffield Slider   Influenza Vaccine    Vaccine Type: Fluvax Non-MCR    Site: right deltoid    Mfr: GlaxoSmithKline    Dose: 0.5 ml    Route: IM    Given by: Arlyss Repress CMA,    Exp. Date: 06/27/2010    Lot #: [id number]    VIS given: 07/22/07 version given February 01, 2010.  Flu Vaccine Consent Questions    Do you have a history of severe allergic reactions to this vaccine? no    Any prior history of allergic reactions to egg and/or gelatin? no    Do you have a sensitivity to the preservative Thimersol? no    Do you have a past history of Guillan-Barre Syndrome? no    Do you currently have an acute febrile illness? no    Have you ever had a severe reaction to latex?  no    Vaccine information given and explained to patient? yes    Are you currently pregnant? yes

## 2011-01-28 NOTE — Miscellaneous (Signed)
Summary: appt at [facility] on 04/09/10 @ 10am with Dr. Ladean Raya  Clinical Lists Changes Lindsey Cunningham from [facility] wants pt to be told at her next ob visit that her appt to discuss birth plan is 04/09/10 at 10am with Dr. Ladean Raya. to pcp .Marland KitchenGolden Circle RN  February 14, 2010 2:24 PM   noted. Jamie Brookes MD  February 18, 2010 7:06 PM

## 2011-03-17 LAB — DIFFERENTIAL
Basophils Absolute: 0 10*3/uL (ref 0.0–0.1)
Basophils Absolute: 0 10*3/uL (ref 0.0–0.1)
Basophils Relative: 0 % (ref 0–1)
Eosinophils Absolute: 0 10*3/uL (ref 0.0–0.7)
Eosinophils Absolute: 0 10*3/uL (ref 0.0–0.7)
Eosinophils Relative: 0 % (ref 0–5)
Lymphocytes Relative: 14 % (ref 12–46)
Lymphs Abs: 1.2 10*3/uL (ref 0.7–4.0)
Neutro Abs: 7.3 10*3/uL (ref 1.7–7.7)

## 2011-03-17 LAB — CBC
Hemoglobin: 11.5 g/dL — ABNORMAL LOW (ref 12.0–15.0)
MCHC: 32.8 g/dL (ref 30.0–36.0)
MCV: 73.8 fL — ABNORMAL LOW (ref 78.0–100.0)
Platelets: 217 10*3/uL (ref 150–400)
RBC: 4.77 MIL/uL (ref 3.87–5.11)
WBC: 15.8 10*3/uL — ABNORMAL HIGH (ref 4.0–10.5)
WBC: 15.5 10*3/uL — ABNORMAL HIGH (ref 4.0–10.5)
WBC: 8.9 10*3/uL (ref 4.0–10.5)

## 2011-03-17 LAB — CULTURE, BLOOD (ROUTINE X 2): Culture: NO GROWTH

## 2011-03-17 LAB — URINE MICROSCOPIC-ADD ON

## 2011-03-17 LAB — URINALYSIS, ROUTINE W REFLEX MICROSCOPIC
Bilirubin Urine: NEGATIVE
Glucose, UA: NEGATIVE mg/dL
Glucose, UA: NEGATIVE mg/dL
Ketones, ur: NEGATIVE mg/dL
Ketones, ur: NEGATIVE mg/dL
Nitrite: NEGATIVE
Urobilinogen, UA: 0.2 mg/dL (ref 0.0–1.0)
pH: 6 (ref 5.0–8.0)
pH: 6 (ref 5.0–8.0)

## 2011-03-17 LAB — RPR: RPR Ser Ql: NONREACTIVE

## 2011-03-17 LAB — COMPREHENSIVE METABOLIC PANEL
Albumin: 3 g/dL — ABNORMAL LOW (ref 3.5–5.2)
Alkaline Phosphatase: 60 U/L (ref 39–117)
BUN: 11 mg/dL (ref 6–23)
CO2: 24 mEq/L (ref 19–32)
Calcium: 8.3 mg/dL — ABNORMAL LOW (ref 8.4–10.5)
Chloride: 105 mEq/L (ref 96–112)
Creatinine, Ser: 0.82 mg/dL (ref 0.4–1.2)
GFR calc Af Amer: >60 mL/min (ref >60)
Glucose, Bld: 103 mg/dL — ABNORMAL HIGH (ref 70–99)
Potassium: 3.7 mEq/L (ref 3.5–5.1)
Total Protein: 6.7 g/dL (ref 6.0–8.3)

## 2011-03-17 LAB — GC/CHLAMYDIA PROBE AMP, GENITAL
Chlamydia, DNA Probe: NEGATIVE
GC Probe Amp, Genital: NEGATIVE

## 2011-03-17 LAB — URINE CULTURE
Colony Count: NO GROWTH
Culture: NO GROWTH

## 2012-03-04 IMAGING — US US TRANSVAGINAL NON-OB
1 series · 13 of 25 positions shown · non-contrast
Comparison: Prior ultrasound of pregnancy performed 01/04/2010

CLINICAL DATA: Postpartum fever; leukocytosis.  Diffuse pelvic
pain.

TRANSABDOMINAL AND TRANSVAGINAL ULTRASOUND OF PELVIS
TECHNIQUE: Both transabdominal and transvaginal ultrasound
examinations of the pelvis were performed including evaluation of
the uterus, ovaries, adnexal regions, and pelvic cul-de-sac.

[Series 1: us transvaginal non-ob · 0.31mm/px · 44 acquisitions, 13 frames shown]
[im 1/44]
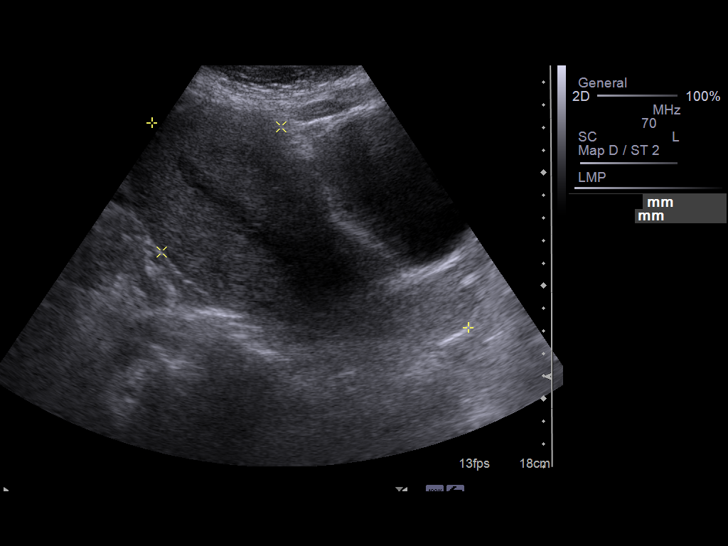
[im 4/44]
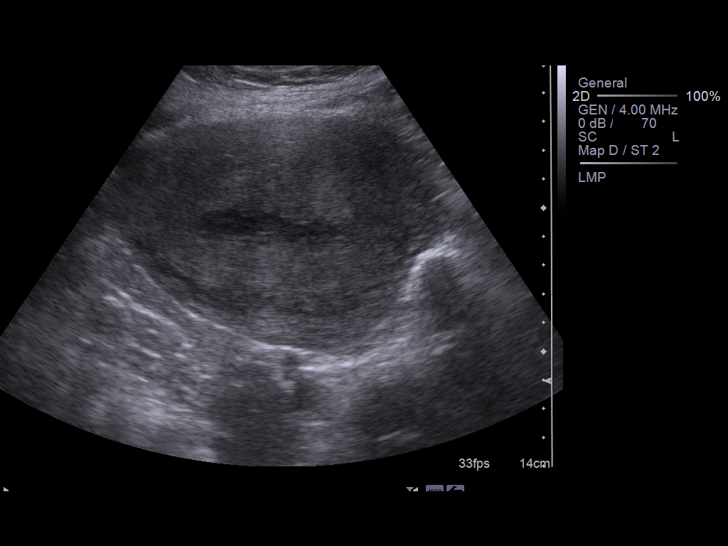
[im 8/44]
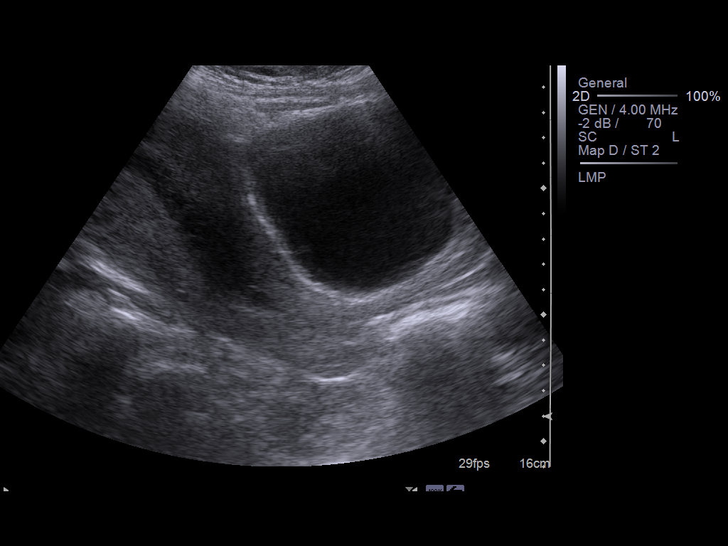
[im 11/44]
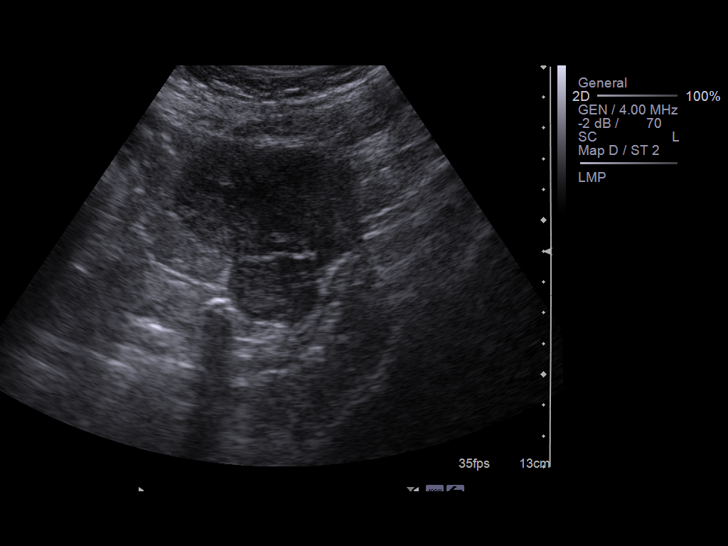
[im 15/44]
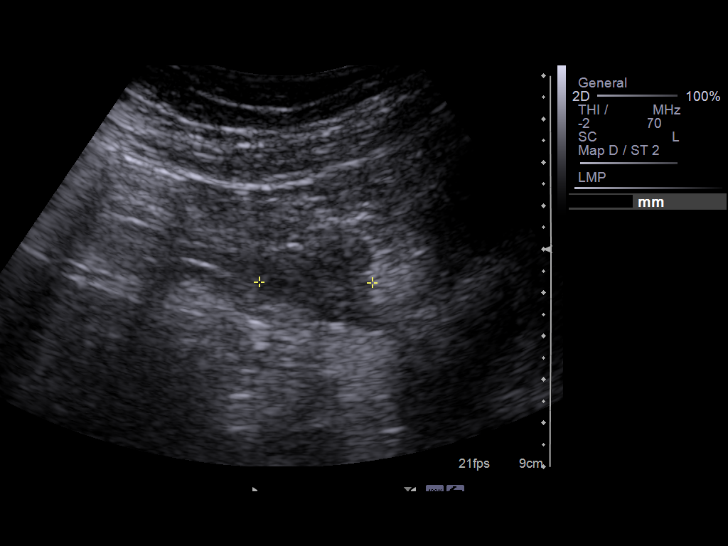
[im 18/44]
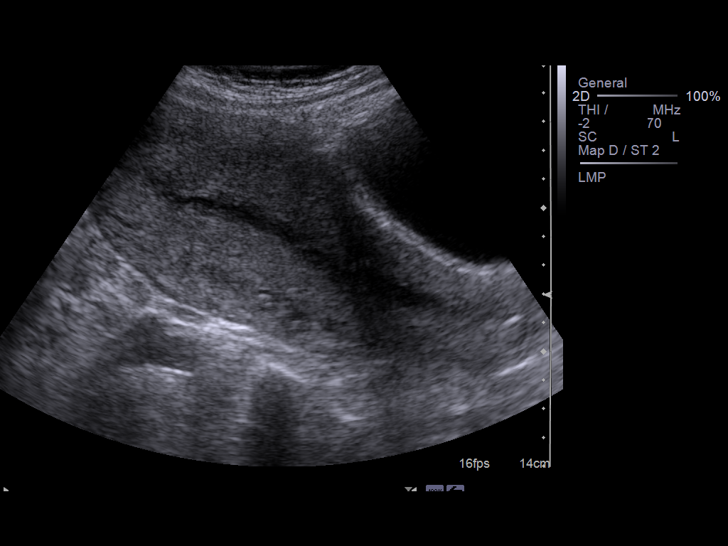
[im 22/44]
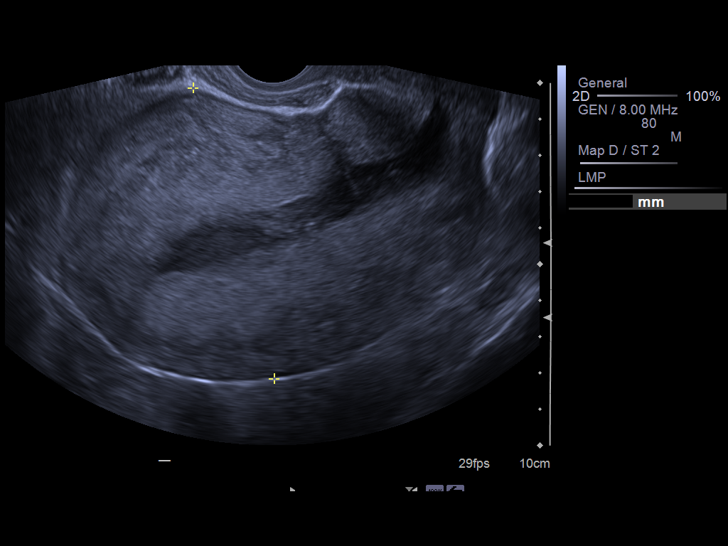
[im 26/44]
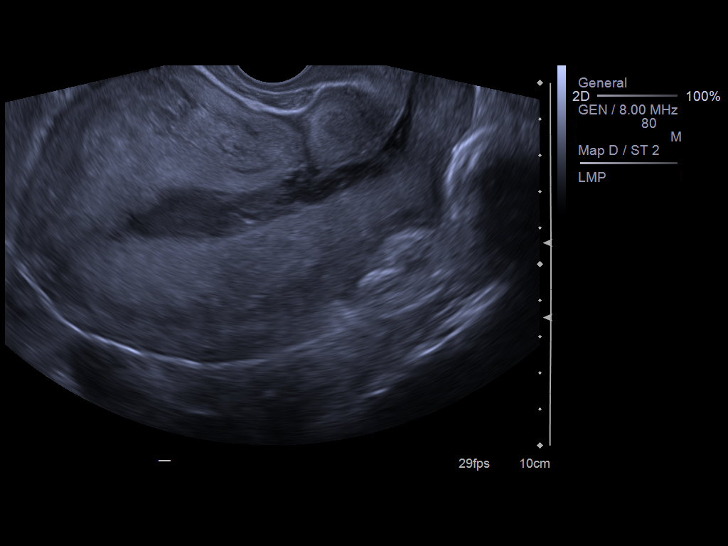
[im 29/44]
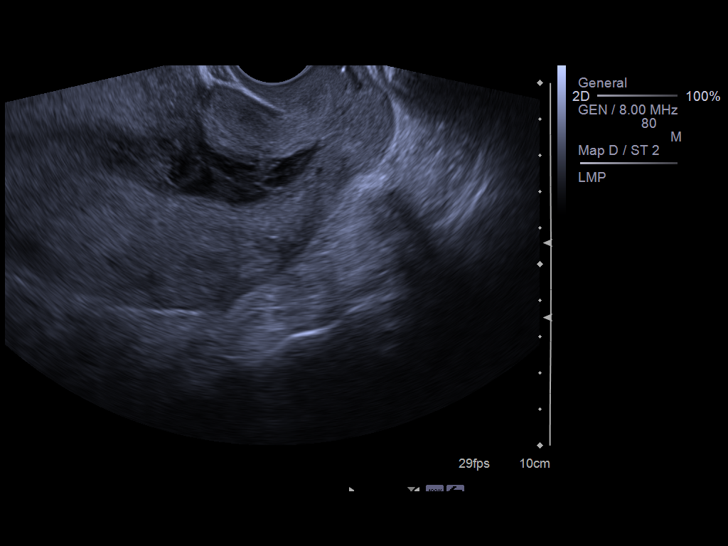
[im 33/44]
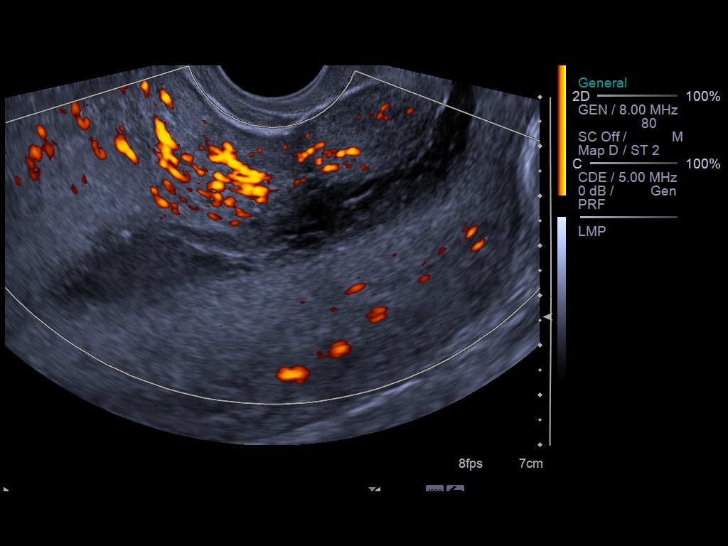
[im 36/44]
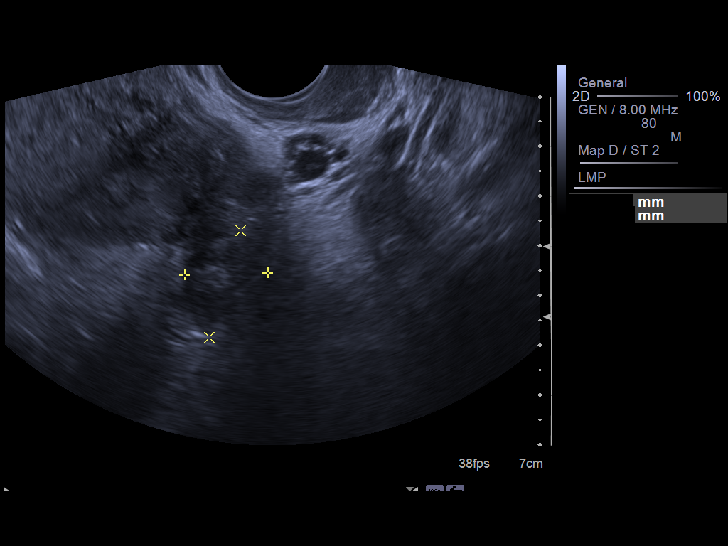
[im 40/44]
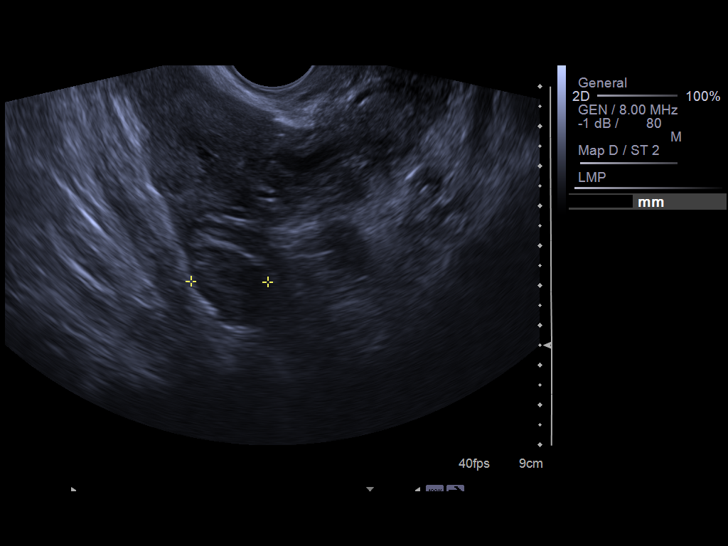
[im 44/44]
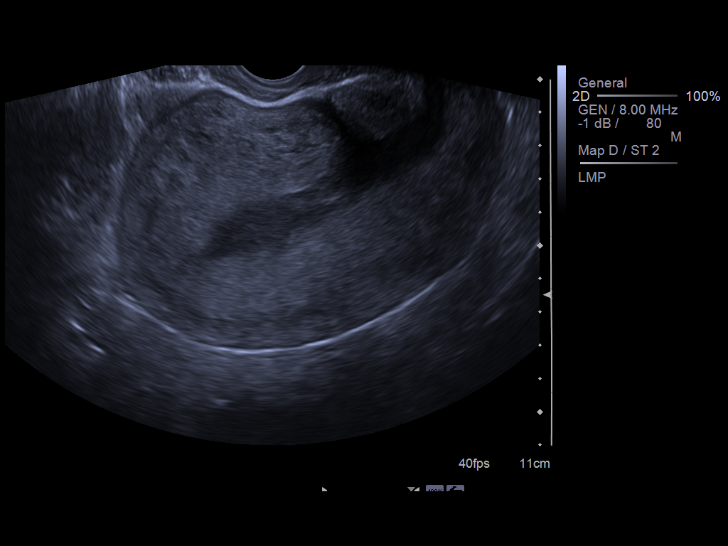

[13 of 25 positions shown; findings below may reference images not displayed]

FINDINGS: The uterus remains enlarged status post pregnancy, measuring
cm in length, 7.7 cm in AP dimension and 11.0 cm in transverse
dimension.  Complex fluid is identified within the endometrial
canal, anteriorly along the lower uterine segment.  This raises
concern for endometritis, postpartum. No abnormal focal Doppler
blood flow is characterized along the endometrial echo complex to
suggest retained products of conception.

The endometrial echo complex measures 1.3 cm in thickness,
reflecting the fluid in the endometrial canal.  Anteriorly at the
lower uterine segment, there is decreased echogenicity within the
uterine wall; this reflects myometrial scarring from the patient's
prior C-section, before the most recent pregnancy.

There is persistent diffusely increased blood flow within the
myometrium on Doppler evaluation, reflecting underlying
inflammation.

The ovaries are unremarkable in appearance.  The right ovary
measures 2.8 x 2.1 x 2.3 cm, while the left ovary measures 2.6 x
2.4 x 1.8 cm.

Trace free fluid is noted within the pelvic cul-de-sac.
IMPRESSION: 1.  Complex fluid noted within the endometrial canal, with
diffusely increased myometrial blood flow; findings raise concern
for endometritis.
2.  No evidence of retained products of conception.

## 2012-03-06 IMAGING — US US MISC SOFT TISSUE
1 series · 14 of 15 positions shown · non-contrast
Comparison: None.

CLINICAL DATA: Breast pain and swelling.

ULTRASOUND OF MISC SOFT TISSUES
TECHNIQUE: Ultrasound examination of the head and neck soft
tissues was performed in the area of clinical concern.

[Series 1: us misc soft tissue · 0.08mm/px · 15 acquisitions, 14 frames shown]
[im 1/15]
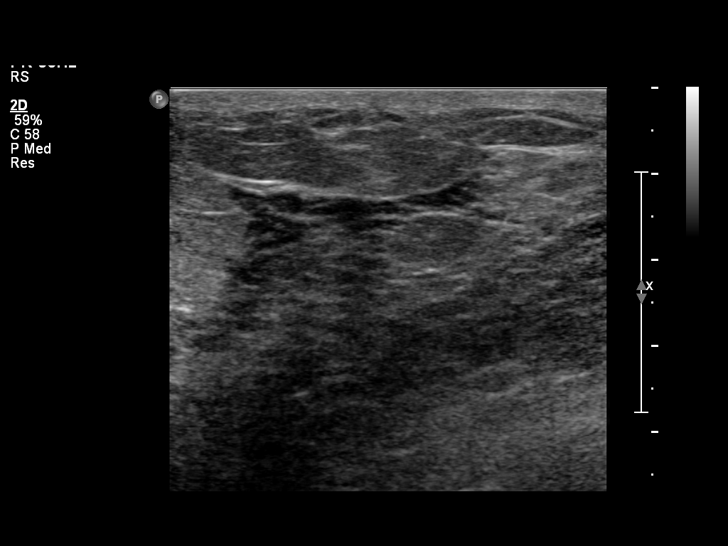
[im 2/15]
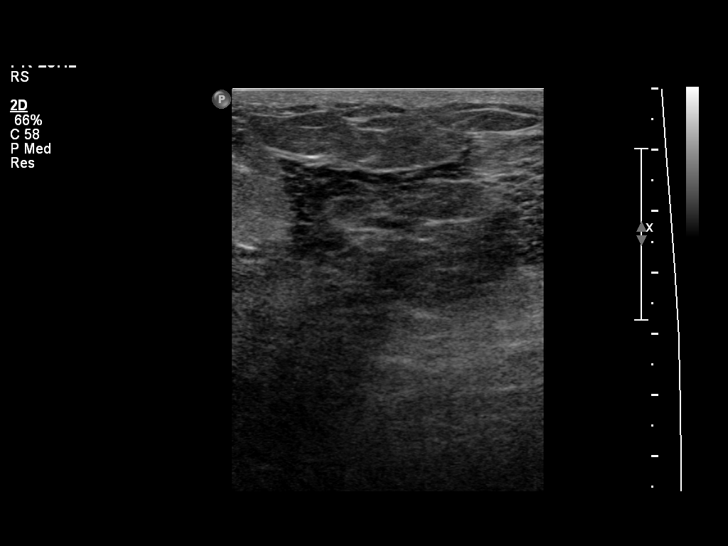
[im 3/15]
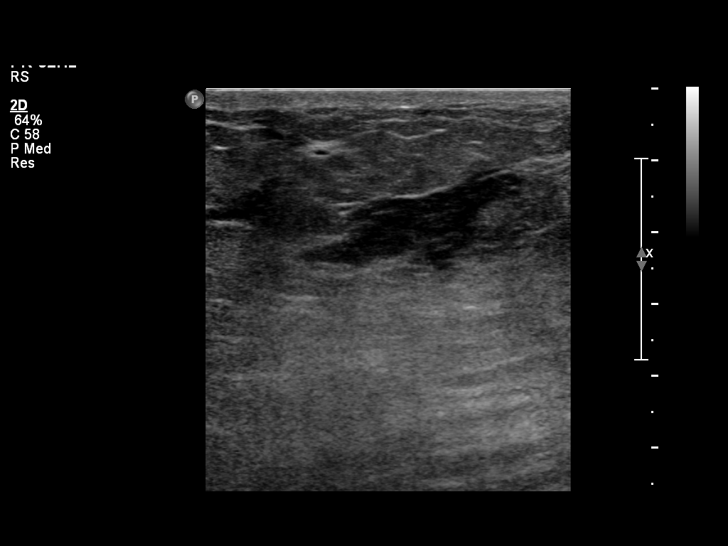
[im 4/15]
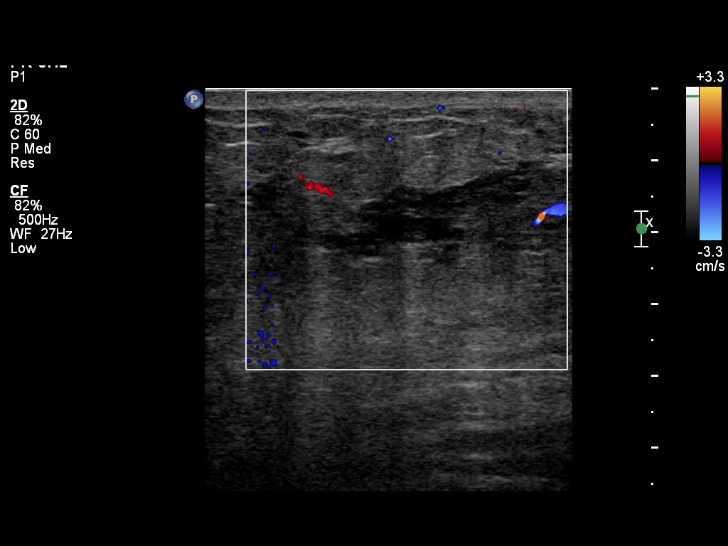
[im 5/15]
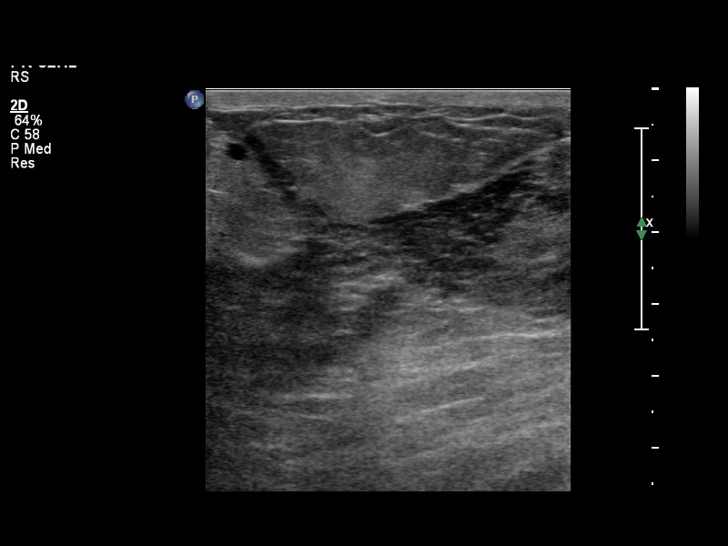
[im 6/15]
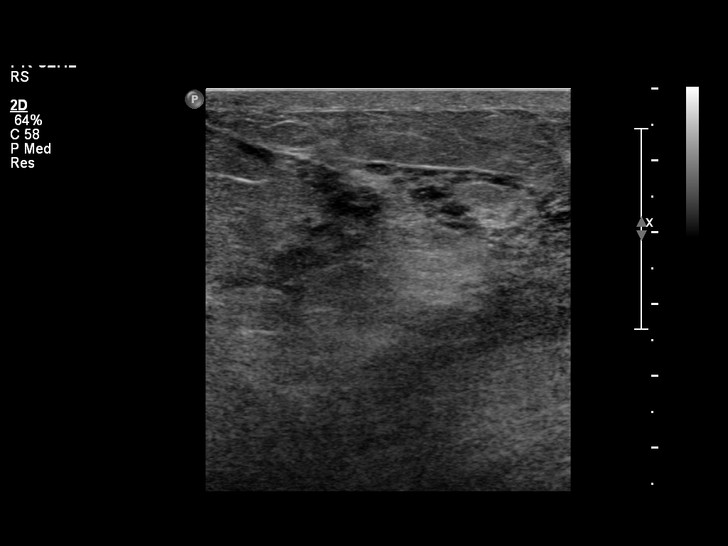
[im 7/15]
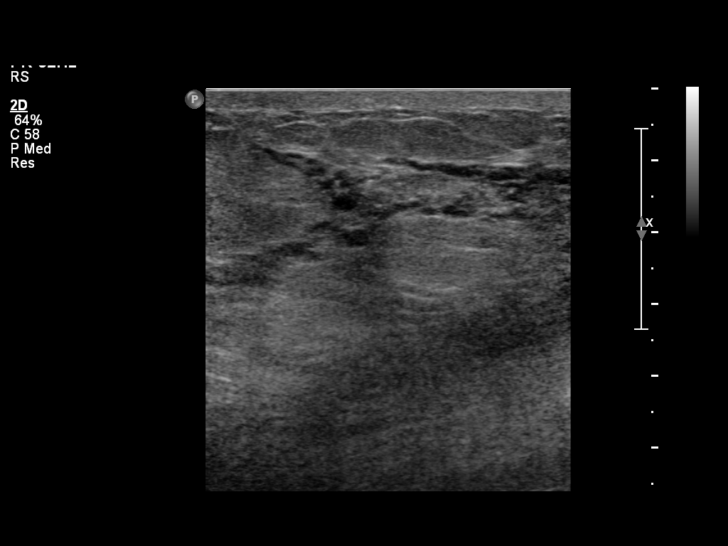
[im 9/15]
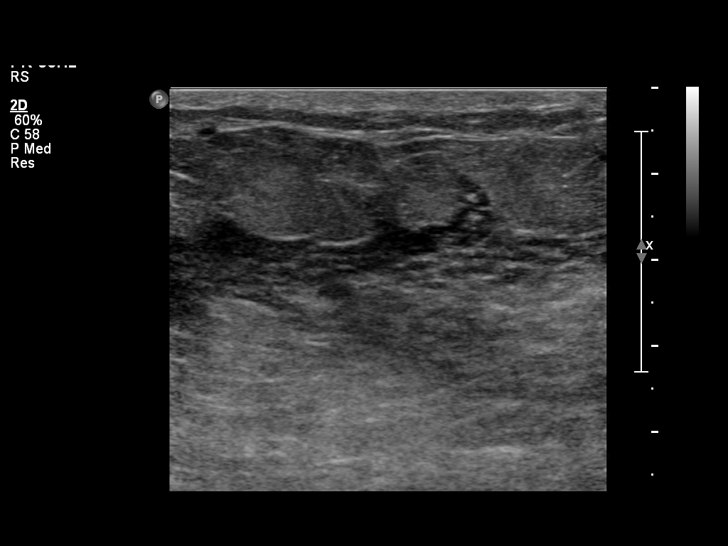
[im 10/15]
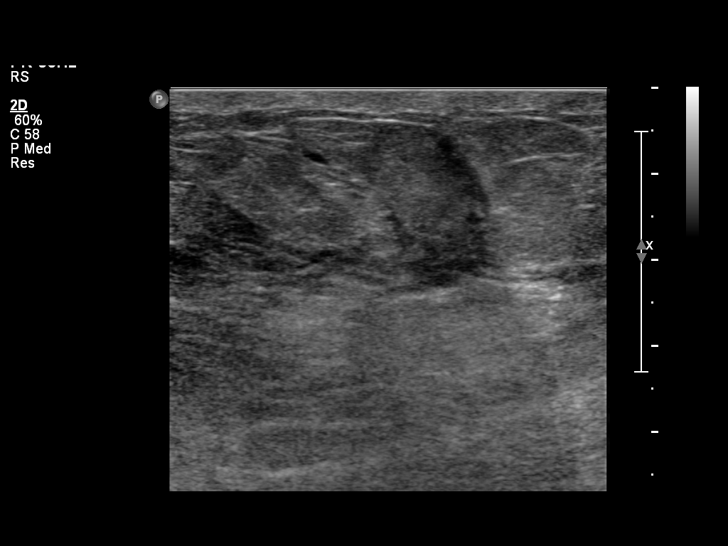
[im 11/15]
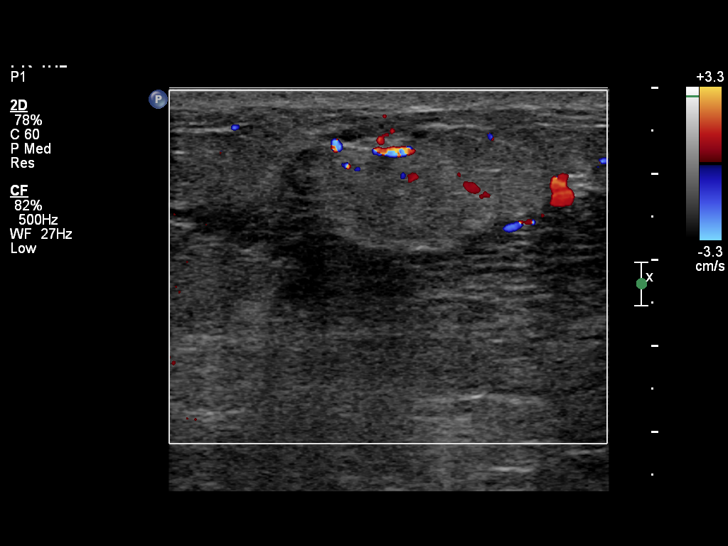
[im 12/15]
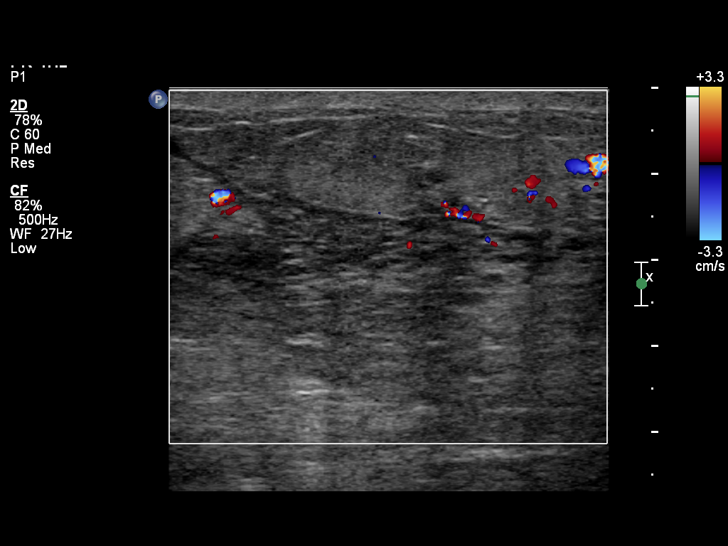
[im 13/15]
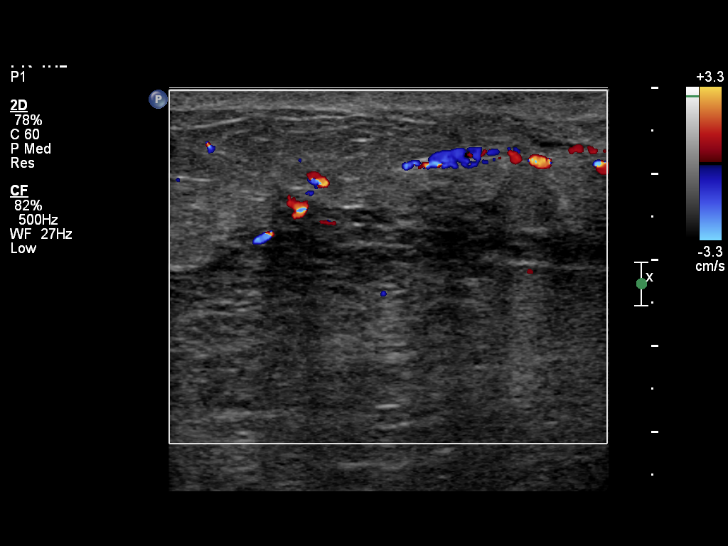
[im 14/15]
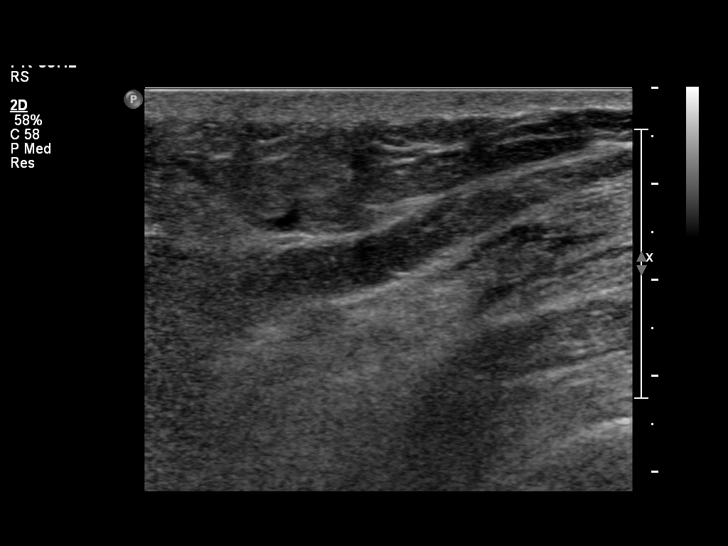
[im 15/15]
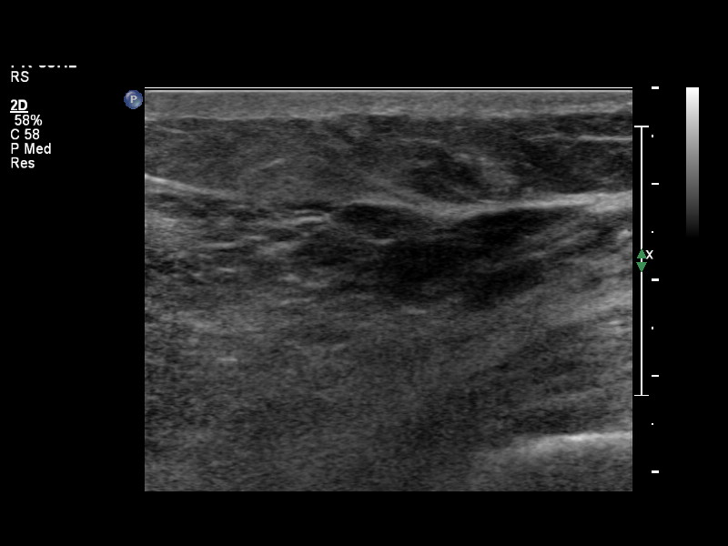

[14 of 15 positions shown; findings below may reference images not displayed]

FINDINGS: Area of indurated skin is noted in the left breast.  The
patient was able to identify the area of concern and documented
this region to the sonographer.  A focused ultrasound exam in this
region shows some edema in the soft tissues of the left breast at
the area of concern, but no focal soft tissue fluid collection is
identified to suggest the presence of an abscess.
IMPRESSION: No focal fluid collection in the area of concern in the left breast
to suggest the presence of an abscess.

Of note, this focused ultrasound exam is not a sufficient study to
exclude inflammatory or focal breast carcinoma.  Mammography could
be performed as clinically warranted to further evaluate.

## 2016-07-22 ENCOUNTER — Ambulatory Visit (INDEPENDENT_AMBULATORY_CARE_PROVIDER_SITE_OTHER): Payer: Self-pay | Admitting: Physician Assistant

## 2016-07-22 VITALS — BP 122/82 | HR 75 | Temp 97.7°F | Resp 18 | Ht <= 58 in | Wt 234.0 lb

## 2016-07-22 DIAGNOSIS — Z30433 Encounter for removal and reinsertion of intrauterine contraceptive device: Secondary | ICD-10-CM

## 2016-07-22 LAB — POCT URINE PREGNANCY: Preg Test, Ur: NEGATIVE

## 2016-07-22 NOTE — Progress Notes (Signed)
[facility] [address] [place] [address] [phone]  Date:  07/22/2016   Name:  Lindsey Cunningham   DOB:  12-17-1976   MRN:  [id number]  PCP:  No PCP Per Patient   Chief Complaint  Patient presents with  . Other    Mirena removed    History of Present Illness:  Lindsey Cunningham is a 40 y.o. female patient who presents to [facility] for IUD removal. Patient reports that has mood changes associated with the IUD.  She has noticed that she is more irritated.  She is sexually active with husband, and will use condoms at this time.  She does not want to have a new mirena, or an oral contraceptive at this time.  She does not want any more children.  The IUD is 21.22 years old. She denies depressive symptoms.  She reports feeling impatient.   Patient Active Problem List   Diagnosis Date Noted  . ENDOMETRITIS 06/05/2010  . INFS GU TRACT PREGNANCY W/DELIV W/CURRENT [facility] 06/05/2010  . INF NIPPLE ASSOC W/CHILDBRTH DELIV W/MENTION [facility] 06/05/2010  . MASTITIS, LACTATING 06/05/2010  . OBESITY, UNSPECIFIED 01/21/2010  . PREVIOUS C-SECT DELIVERY ANTPRTM COND/COMP 01/21/2010    No past medical history on file.  No past surgical history on file.  Social History  Substance Use Topics  . Smoking status: Never Smoker  . Smokeless tobacco: Not on file  . Alcohol use Not on file    No family history on file.  Not on File  Medication list has been reviewed and updated.  Current Outpatient Prescriptions on File Prior to Visit  Medication Sig Dispense Refill  . clindamycin (CLEOCIN) 150 MG capsule Take 150 mg by mouth every 6 (six) hours. For 2 days     . clindamycin (CLEOCIN) 300 MG capsule Take 300 mg by mouth every 6 (six) hours. For 2 days     . Prenatal Vitamins (CALNA) TABS 1 by mouth daily      No current facility-administered medications on file prior to visit.     ROS ROS otherwise unremarkable unless listed above.  Physical Examination: BP 122/82   Pulse 75    Temp 97.7 F (36.5 C) (Oral)   Resp 18   Ht 4' 8.75" (1.441 m)   Wt 234 lb (106.1 kg)   SpO2 100%   BMI 51.08 kg/m  Ideal Body Weight: Weight in (lb) to have BMI = 25: 114.3  Physical Exam  Constitutional: She is oriented to person, place, and time. She appears well-developed and well-nourished. No distress.  HENT:  Head: Normocephalic and atraumatic.  Right Ear: External ear normal.  Left Ear: External ear normal.  Eyes: Conjunctivae and EOM are normal. Pupils are equal, round, and reactive to light.  Cardiovascular: Normal rate.   Pulmonary/Chest: Effort normal. No respiratory distress.  Abdominal: Hernia confirmed negative in the right inguinal area and confirmed negative in the left inguinal area.  Genitourinary: Vagina normal. Pelvic exam was performed with patient supine. There is no rash on the right labia. There is no rash on the left labia. Right adnexum displays no mass. Left adnexum displays no mass.  Genitourinary Comments: IUD in place with strings pertruding from the cervical os.  Neurological: She is alert and oriented to person, place, and time.  Skin: She is not diaphoretic.  Psychiatric: She has a normal mood and affect. Her behavior is normal.     Assessment and Plan: Lindsey Cunningham is a 40 y.o. female  who is here today for IUD removal This was done successfully. Advised contraceptive counseling.  She will return in 4 weeks if symptoms appear not to change.   Encounter for removal and reinsertion of IUD - Plan: POCT urine pregnancy, Care order/instruction:   Ivar Drape, PA-C [facility] 07/22/2016 3:30 PM

## 2016-07-22 NOTE — Patient Instructions (Addendum)
     IF you received an x-ray today, you will receive an invoice from [facility]. Please contact [facility] at [phone] with questions or concerns regarding your invoice.   IF you received labwork today, you will receive an invoice from [employer]. Please contact [facility] at [phone] with questions or concerns regarding your invoice.   Our billing staff will not be able to assist you with questions regarding bills from these companies.  You will be contacted with the lab results as soon as they are available. The fastest way to get your results is to activate your My Chart account. Instructions are located on the last page of this paperwork. If you have not heard from [place] regarding the results in 2 weeks, please contact this office.    Please let us know if we can do anything for you.   If you are having change in your mood continued, or back pain--then this should be evaluated.   Please make sure that you are using some form of birth control if you wish to not get pregnant at this time.

## 2018-03-15 ENCOUNTER — Other Ambulatory Visit: Payer: Self-pay | Admitting: Dermatology

## 2019-07-10 ENCOUNTER — Other Ambulatory Visit: Payer: Self-pay

## 2019-07-10 ENCOUNTER — Emergency Department (HOSPITAL_BASED_OUTPATIENT_CLINIC_OR_DEPARTMENT_OTHER)
Admission: EM | Admit: 2019-07-10 | Discharge: 2019-07-11 | Disposition: A | Discharge status: Home / Self Care | Payer: Self-pay | Attending: Emergency Medicine | Admitting: Emergency Medicine

## 2019-07-10 ENCOUNTER — Encounter (HOSPITAL_BASED_OUTPATIENT_CLINIC_OR_DEPARTMENT_OTHER): Payer: Self-pay | Admitting: *Deleted

## 2019-07-10 ENCOUNTER — Emergency Department (HOSPITAL_BASED_OUTPATIENT_CLINIC_OR_DEPARTMENT_OTHER): Payer: Self-pay

## 2019-07-10 DIAGNOSIS — R079 Chest pain, unspecified: Secondary | ICD-10-CM

## 2019-07-10 DIAGNOSIS — R0602 Shortness of breath: Secondary | ICD-10-CM | POA: Insufficient documentation

## 2019-07-10 DIAGNOSIS — R0789 Other chest pain: Secondary | ICD-10-CM | POA: Insufficient documentation

## 2019-07-10 HISTORY — DX: Chest pain, unspecified: R07.9

## 2019-07-10 LAB — CBC
HCT: 40 % (ref 36.0–46.0)
Hemoglobin: 12.2 g/dL (ref 12.0–15.0)
MCH: 21.8 pg — ABNORMAL LOW (ref 26.0–34.0)
MCHC: 30.5 g/dL (ref 30.0–36.0)
MCV: 71.4 fL — ABNORMAL LOW (ref 80.0–100.0)
Platelets: 296 10*3/uL (ref 150–400)
RBC: 5.6 MIL/uL — ABNORMAL HIGH (ref 3.87–5.11)
RDW: 15.7 % — ABNORMAL HIGH (ref 11.5–15.5)
WBC: 13 10*3/uL — ABNORMAL HIGH (ref 4.0–10.5)
nRBC: 0 % (ref 0.0–0.2)

## 2019-07-10 LAB — BASIC METABOLIC PANEL
Anion gap: 10 (ref 5–15)
BUN: 11 mg/dL (ref 6–20)
CO2: 24 mmol/L (ref 22–32)
Calcium: 8.7 mg/dL — ABNORMAL LOW (ref 8.9–10.3)
Chloride: 104 mmol/L (ref 98–111)
Creatinine, Ser: 0.83 mg/dL (ref 0.44–1.00)
GFR calc Af Amer: >60 mL/min (ref >60)
GFR calc non Af Amer: >60 mL/min (ref >60)
Glucose, Bld: 99 mg/dL (ref 70–99)
Potassium: 3.8 mmol/L (ref 3.5–5.1)
Sodium: 138 mmol/L (ref 135–145)

## 2019-07-10 LAB — TROPONIN I (HIGH SENSITIVITY): Troponin I (High Sensitivity): <2 ng/L (ref <18)

## 2019-07-10 MED ORDER — SODIUM CHLORIDE 0.9% FLUSH
3.0000 mL | Freq: Once | INTRAVENOUS | Status: DC
  Filled 2019-07-10: qty 3

## 2019-07-10 NOTE — ED Triage Notes (Signed)
Left sided CP with SOB x 1 day. Increased pain with deep inspiration. Denies pain with movement.  Increased pain with back palpation.

## 2019-07-11 LAB — TROPONIN I (HIGH SENSITIVITY): Troponin I (High Sensitivity): <2 ng/L (ref <18)

## 2019-07-11 LAB — D-DIMER, QUANTITATIVE: D-Dimer, Quant: 0.41 ug/mL-FEU (ref 0.00–0.50)

## 2019-07-11 MED ORDER — NAPROXEN 500 MG PO TABS: 500.0000 mg | ORAL_TABLET | Freq: Two times a day (BID) | ORAL | 0 refills | Status: DC

## 2019-07-11 MED ORDER — KETOROLAC TROMETHAMINE 30 MG/ML IJ SOLN
30.0000 mg | Freq: Once | INTRAMUSCULAR | Status: DC
  Filled 2019-07-11: qty 1

## 2019-07-11 MED ORDER — KETOROLAC TROMETHAMINE 30 MG/ML IJ SOLN
30.0000 mg | Freq: Once | INTRAMUSCULAR | Status: AC
  Administered 2019-07-11: 01:00:00 30 mg via INTRAVENOUS

## 2019-07-11 NOTE — Discharge Instructions (Addendum)
You were seen today for chest pain.  Your work-up is reassuring.  Your heart testing and screening test for blood clots is negative.  Take naproxen as needed for pain.

## 2019-07-11 NOTE — ED Provider Notes (Signed)
[place] EMERGENCY DEPARTMENT Provider Note   CSN: [id number] Arrival date & time: 07/10/19  2140     History   Chief Complaint Chief Complaint  Patient presents with  . Chest Pain    HPI Lindsey Cunningham is a 43 y.o. female.     HPI  This is a 43 year old female who presents with chest discomfort.  Reports 1 day history of chest pain or shortness of breath.  No known sick contacts.  No fever or cough.  Pain is worse with inspiration.  It is sharp and over the left side of the chest.  Occasionally she has left back pain.  She has not taken anything for her pain.  Denies any history of blood clots, lower extremity swelling, recent travel, recent hospitalization, use of estrogen products.  Currently she rates her pain at 6 out of 10.  Denies any known sick contacts.  Family is at the bedside translating.  Patient primarily speaks Arabic.  History reviewed. No pertinent past medical history.  Patient Active Problem List   Diagnosis Date Noted  . ENDOMETRITIS 06/05/2010  . INFS GU TRACT PREGNANCY W/DELIV W/CURRENT [facility] 06/05/2010  . INF NIPPLE ASSOC W/CHILDBRTH DELIV W/MENTION [facility] 06/05/2010  . MASTITIS, LACTATING 06/05/2010  . OBESITY, UNSPECIFIED 01/21/2010  . PREVIOUS C-SECT DELIVERY ANTPRTM COND/COMP 01/21/2010    History reviewed. No pertinent surgical history.   OB History   No obstetric history on file.      Home Medications    Prior to Admission medications   Medication Sig Start Date End Date Taking? Authorizing Provider  clindamycin (CLEOCIN) 150 MG capsule Take 150 mg by mouth every 6 (six) hours. For 2 days     [provider]  clindamycin (CLEOCIN) 300 MG capsule Take 300 mg by mouth every 6 (six) hours. For 2 days     [provider]  naproxen (NAPROSYN) 500 MG tablet Take 1 tablet (500 mg total) by mouth 2 (two) times daily. 07/11/19   Mica Releford, Barbette Hair, MD  Prenatal Vitamins Anders Grant) TABS 1 by mouth daily     [provider]    Family History History reviewed. No pertinent family history.  Social History Social History   Tobacco Use  . Smoking status: Never Smoker  Substance Use Topics  . Alcohol use: Not on file  . Drug use: Not on file     Allergies   Patient has no known allergies.   Review of Systems Review of Systems  Constitutional: Negative for fever.  Respiratory: Positive for shortness of breath. Negative for cough.   Cardiovascular: Positive for chest pain. Negative for palpitations and leg swelling.  Gastrointestinal: Negative for abdominal pain, nausea and vomiting.  Genitourinary: Negative for dysuria.  All other systems reviewed and are negative.    Physical Exam Updated Vital Signs BP 121/66   Pulse 87   Temp 98.3 F (36.8 C) (Oral)   Resp 18   Wt 113.4 kg   SpO2 100%   BMI 54.58 kg/m   Physical Exam Vitals signs and nursing note reviewed.  Constitutional:      Appearance: She is well-developed. She is obese. She is not ill-appearing or toxic-appearing.  HENT:     Head: Normocephalic and atraumatic.  Eyes:     Pupils: Pupils are equal, round, and reactive to light.  Neck:     Musculoskeletal: Neck supple.  Cardiovascular:     Rate and Rhythm: Normal rate and regular rhythm.  Heart sounds: Normal heart sounds.  Pulmonary:     Effort: Pulmonary effort is normal. No respiratory distress.     Breath sounds: No wheezing.  Chest:     Chest wall: Tenderness present.  Abdominal:     General: Bowel sounds are normal.     Palpations: Abdomen is soft.  Skin:    General: Skin is warm and dry.  Neurological:     Mental Status: She is alert and oriented to person, place, and time.  Psychiatric:        Mood and Affect: Mood normal.      ED Treatments / Results  Labs (all labs ordered are listed, but only abnormal results are displayed) Labs Reviewed  BASIC METABOLIC PANEL - Abnormal; Notable for the following components:      Result Value    Calcium 8.7 (*)    All other components within normal limits  CBC - Abnormal; Notable for the following components:   WBC 13.0 (*)    RBC 5.60 (*)    MCV 71.4 (*)    MCH 21.8 (*)    RDW 15.7 (*)    All other components within normal limits  D-DIMER, QUANTITATIVE (NOT AT [facility])  PREGNANCY, URINE  TROPONIN I (HIGH SENSITIVITY)  TROPONIN I (HIGH SENSITIVITY)    EKG EKG Interpretation  Date/Time:  Sunday July 10 2019 21:49:13 EDT Ventricular Rate:  90 PR Interval:  162 QRS Duration: 88 QT Interval:  364 QTC Calculation: 445 R Axis:   -20 Text Interpretation:  Normal sinus rhythm Cannot rule out Anterior infarct , age undetermined Abnormal ECG No prior for comparison Confirmed by Thayer Jew [phone]) on 07/10/2019 11:55:40 PM   Radiology Dg Chest 2 View  Result Date: 07/10/2019 CLINICAL DATA:  Chest pain, left upper back pain EXAM: CHEST - 2 VIEW COMPARISON:  None. FINDINGS: The heart size and mediastinal contours are within normal limits. Both lungs are clear. The visualized skeletal structures are unremarkable. IMPRESSION: No active cardiopulmonary disease. Electronically Signed   By: Donavan Foil M.D.   On: 07/10/2019 22:13    Procedures Procedures (including critical care time)  Medications Ordered in ED Medications  sodium chloride flush (NS) 0.9 % injection 3 mL (has no administration in time range)  ketorolac (TORADOL) 30 MG/ML injection 30 mg (30 mg Intravenous Given 07/11/19 0102)     Initial Impression / Assessment and Plan / ED Course  I have reviewed the triage vital signs and the nursing notes.  Pertinent labs & imaging results that were available during my care of the patient were reviewed by me and considered in my medical decision making (see chart for details).        Patient presents with chest pain.  She is overall nontoxic-appearing and vital signs are reassuring.  EKG shows no evidence of ischemia or arrhythmia.  Chest x-ray shows no evidence  of pneumothorax or pneumonia.  She was screened with d-dimer for blood clot.  This is negative.  Patient was given Toradol for pain given reproducible nature of pain.  Troponin x2 is negative.  This effectively comes ACS and she is otherwise low risk.  After history, exam, and medical workup I feel the patient has been appropriately medically screened and is safe for discharge home. Pertinent diagnoses were discussed with the patient. Patient was given return precautions.   Final Clinical Impressions(s) / ED Diagnoses   Final diagnoses:  Atypical chest pain    ED Discharge Orders  Ordered    naproxen (NAPROSYN) 500 MG tablet  2 times daily     07/11/19 0209           Merryl Hacker, MD 07/11/19 0221

## 2021-01-08 ENCOUNTER — Encounter: Payer: Self-pay | Admitting: Family Medicine

## 2021-01-25 ENCOUNTER — Ambulatory Visit (INDEPENDENT_AMBULATORY_CARE_PROVIDER_SITE_OTHER): Payer: Self-pay | Admitting: Family Medicine

## 2021-01-25 ENCOUNTER — Encounter: Payer: Self-pay | Admitting: Family Medicine

## 2021-01-25 ENCOUNTER — Other Ambulatory Visit: Payer: Self-pay

## 2021-01-25 VITALS — BP 157/93 | HR 111 | Wt 273.4 lb

## 2021-01-25 DIAGNOSIS — N939 Abnormal uterine and vaginal bleeding, unspecified: Secondary | ICD-10-CM | POA: Insufficient documentation

## 2021-01-25 DIAGNOSIS — Z3202 Encounter for pregnancy test, result negative: Secondary | ICD-10-CM

## 2021-01-25 LAB — POCT PREGNANCY, URINE: Preg Test, Ur: NEGATIVE

## 2021-01-25 MED ORDER — MEGESTROL ACETATE 20 MG PO TABS: 20.0000 mg | ORAL_TABLET | Freq: Every day | ORAL | 5 refills | Status: DC

## 2021-01-25 NOTE — Progress Notes (Signed)
GYNECOLOGY OFFICE VISIT NOTE  History:   Lindsey Cunningham is a 45 y.o. [id number] here today for abnormal bleeding.  Reports irregular prolonged periods for the past two months Has bled constantly for the past 1.5 months Has also passed large clots, bring photos Used to be regular up to one year ago, and then starting a year ago started to become irregular but was not continuous or heavy like it is at present Used to only last 4 days Mother had leukemia, maternal uncle also had a glandular cancer No family hx of colon or uterine cancer Non-smoker Last pap on file was NILM in 2011, reports none since then, denies any prior hx of abnormal pap Reports hx of two sisters who has fibroid uterus and had them removed  Review of chart shows she had Mirena IUD removed in 06/2016   History reviewed. No pertinent past medical history.  Past Surgical History:  Procedure Laterality Date  . CESAREAN SECTION      The following portions of the patient's history were reviewed and updated as appropriate: allergies, current medications, past family history, past medical history, past social history, past surgical history and problem list.   Health Maintenance:  Normal pap and negative HRHPV: 2011.  Normal mammogram: none on file.   Review of Systems:  Pertinent items noted in HPI and remainder of comprehensive ROS otherwise negative.  Physical Exam:  BP (!) 157/93   Pulse (!) 111   Wt 273 lb 6.4 oz (124 kg)   LMP 11/30/2020   BMI 59.69 kg/m  CONSTITUTIONAL: Well-developed, well-nourished female in no acute distress.  HEENT:  Normocephalic, atraumatic. External right and left ear normal. No scleral icterus.  NECK: Normal range of motion, supple, no masses noted on observation SKIN: No rash noted. Not diaphoretic. No erythema. No pallor. MUSCULOSKELETAL: Normal range of motion. No edema noted. NEUROLOGIC: Alert and oriented to person, place, and time. Normal muscle tone coordination.   PSYCHIATRIC: Normal mood and affect. Normal behavior. Normal judgment and thought content. RESPIRATORY: Effort normal, no problems with respiration noted ABDOMEN: No masses noted. No other overt distention noted.   PELVIC: Normal appearing external genitalia; normal appearing vaginal mucosa and cervix, scant brown blood in vault.  No abnormal discharge noted.  Questionable if uterus enlarged, difficult to tell with habitus, no other palpable masses, no uterine or adnexal tenderness.  Labs and Imaging Results for orders placed or performed in visit on 01/25/21 (from the past 168 hour(s))  Pregnancy, urine POC   Collection Time: 01/25/21 10:41 AM  Result Value Ref Range   Preg Test, Ur NEGATIVE NEGATIVE   No results found.     Assessment and Plan:   Problem List Items Addressed This Visit      Genitourinary   Abnormal uterine bleeding (AUB) - Primary    Patient presenting with one year history of abnormal uterine bleeding in setting of obesity as well as family hx of fibroids. Discussed the following possibilities: early signs of menopause, fibroids, anovulation secondary to obesity, and early signs of cervical or uterine cancer. Exam reassuring against cervical abnormalities. UPT was negative. Recommended patient get pap and EMB for workup, she will contact [facility] financial aid and [facility] for these due to lack of insurance. Recommended we trial megace 20mg  daily to control bleeding now and strongly recommended she return to a hormonal IUD for both symptom management and endometrial protection. She will [facility] an appt at the [facility] for this.  Relevant Medications   megestrol (MEGACE) 20 MG tablet      Routine preventative health maintenance measures emphasized. Please refer to After Visit Summary for other counseling recommendations.   Return in about 4 weeks (around 02/22/2021) for endometrial biopsy and BCCCP pap once she has completed [facility] financial aid application and [facility]CP app.     Total face-to-face time with patient: 30 minutes.  Over 50% of encounter was spent on counseling and coordination of care.   Clarnce Flock, MD/[facility] [employer], [place]

## 2021-01-25 NOTE — Patient Instructions (Signed)
Abnormal Uterine Bleeding Abnormal uterine bleeding means bleeding more than usual from your womb (uterus). It can include:  Bleeding between menstrual periods.  Bleeding after sex.  Bleeding that is heavier than normal.  Menstrual periods that last longer than usual.  Bleeding after you have stopped having your menstrual period (menopause). There are many problems that may cause this. You should see a doctor for any kind of bleeding that is not normal. Treatment depends on the cause of the bleeding. Follow these instructions at home: Medicines  Take over-the-counter and prescription medicines only as told by your doctor.  Tell your doctor about other medicines that you take. ? If told by your doctor, stop taking aspirin or medicines that have aspirin in them. These medicines can make you bleed more.  You may be given iron pills to replace iron that your body loses because of this condition. Take them as told by your doctor. Managing constipation If you are taking iron pills, you may have trouble pooping (constipation). To prevent or treat trouble pooping, you may need to:  Drink enough fluid to keep your pee (urine) pale yellow.  Take over-the-counter or prescription medicines.  Eat foods that are high in fiber. These include beans, whole grains, and fresh fruits and vegetables.  Limit foods that are high in fat and sugar. These include fried or sweet foods. General instructions  Watch your condition for any changes.  Do not use tampons, douche, or have sex, if your doctor tells you not to.  Change your pads often.  Get regular exams. This includes pelvic exams and cervical cancer screenings. ? It is up to you to get the results of any tests that are done. Ask your doctor, or the department that is doing the tests, when your results will be ready.  Keep all follow-up visits as told by your doctor. This is important. Contact a doctor if:  The bleeding lasts more than 1  week.  You feel dizzy at times.  You feel like you may vomit (nausea).  You vomit.  You feel light-headed or weak.  Your symptoms get worse. Get help right away if:  You pass out.  You have to change pads every hour.  You have pain in your belly.  You have a fever or chills.  You get sweaty.  You get weak.  You pass large blood clots from your vagina. Summary  Abnormal uterine bleeding means bleeding more than usual from your womb (uterus).  Any kind of bleeding that is not normal should be checked by a doctor.  Treatment depends on the cause of the bleeding.  Get help right away if you pass out, you have to change pads every hour, or you pass large blood clots from your vagina. This information is not intended to replace advice given to you by your health care provider. Make sure you discuss any questions you have with your health care provider. Document Revised: 10/18/2019 Document Reviewed: 10/18/2019 Elsevier Patient Education  2021 Elsevier Inc.  

## 2021-01-25 NOTE — Progress Notes (Addendum)
Video Interpreter # Hollice Gong [phone] [employer] faxed and received successful transmission.   Free Pap Screening information provided to the pt to call and schedule appt.    Mel Almond, RN  01/25/21

## 2021-01-25 NOTE — Assessment & Plan Note (Signed)
Patient presenting with one year history of abnormal uterine bleeding in setting of obesity as well as family hx of fibroids. Discussed the following possibilities: early signs of menopause, fibroids, anovulation secondary to obesity, and early signs of cervical or uterine cancer. Exam reassuring against cervical abnormalities. UPT was negative. Recommended patient get pap and EMB for workup, she will contact [facility] financial aid and [facility]CP for these due to lack of insurance. Recommended we trial megace 20mg  daily to control bleeding now and strongly recommended she return to a hormonal IUD for both symptom management and endometrial protection. She will make an appt at the [facility] for this.

## 2021-02-25 ENCOUNTER — Other Ambulatory Visit: Payer: Self-pay

## 2021-02-25 ENCOUNTER — Other Ambulatory Visit: Payer: Self-pay | Admitting: *Deleted

## 2021-02-25 DIAGNOSIS — Z124 Encounter for screening for malignant neoplasm of cervix: Secondary | ICD-10-CM

## 2021-02-25 DIAGNOSIS — Z1231 Encounter for screening mammogram for malignant neoplasm of breast: Secondary | ICD-10-CM

## 2021-02-25 NOTE — Progress Notes (Signed)
Patient: Lindsey Cunningham           Date of Birth: 1976-09-28           MRN: [id number] Visit Date: 02/25/2021 PCP: Patient, No Pcp Per  Cervical Cancer Screening Do you smoke?: No Have you ever had or been told you have an allergy to latex products?: No Marital status: Married Date of last pap smear: More than 5 yrs ago Date of last menstrual period: 12/09/20 Number of pregnancies: 4 (4) Number of births: 4 Have you ever had any of the following? Hysterectomy: No Tubal ligation (tubes tied): No Abnormal bleeding: Yes Abnormal pap smear: No Venereal warts: No A sex partner with venereal warts: No A high risk* sex partner: No  Cervical Exam  Abnormal Observations: Normal Exam. Recommendations: Last Pap smear was around 5-6 years ago and normal per patient. Per patient has no history of an abnormal Pap smear. No Pap smear results are available in Epic. If today's Pap smear is normal and HPV negative next Pap smear will be due in 5 years.     Used Radio broadcast assistant Saif [phone] through [employer].  Patient's History Patient Active Problem List   Diagnosis Date Noted  . Abnormal uterine bleeding (AUB) 01/25/2021  . ENDOMETRITIS 06/05/2010  . INFS GU TRACT PREGNANCY W/DELIV W/CURRENT [facility] 06/05/2010  . INF NIPPLE ASSOC W/CHILDBRTH DELIV W/MENTION [facility] 06/05/2010  . MASTITIS, LACTATING 06/05/2010  . OBESITY, UNSPECIFIED 01/21/2010  . PREVIOUS C-SECT DELIVERY ANTPRTM COND/COMP 01/21/2010   No past medical history on file.  No family history on file.  Social History   Occupational History  . Not on file  Tobacco Use  . Smoking status: Never Smoker  . Smokeless tobacco: Never Used  Substance and Sexual Activity  . Alcohol use: Never  . Drug use: Never  . Sexual activity: Yes    Partners: Male    Birth control/protection: I.U.D.

## 2021-02-27 LAB — CYTOLOGY - PAP
Comment: NEGATIVE
Diagnosis: NEGATIVE
High risk HPV: NEGATIVE

## 2021-02-28 ENCOUNTER — Telehealth: Payer: Self-pay

## 2021-02-28 NOTE — Telephone Encounter (Signed)
Patient's husband, Lindsey Cunningham informed negative Pap/HPV results (records release in chart due to language barrier), next pap due in 5 years, needs physicals and mammograms every year, time & date of [facility] appointment provided. Mr. Lindsey Cunningham verbalized understanding.

## 2021-04-04 ENCOUNTER — Ambulatory Visit: Payer: Self-pay

## 2021-04-17 ENCOUNTER — Ambulatory Visit: Payer: Self-pay

## 2021-04-24 ENCOUNTER — Inpatient Hospital Stay: Payer: Self-pay | Attending: Obstetrics and Gynecology | Admitting: *Deleted

## 2021-04-24 ENCOUNTER — Other Ambulatory Visit: Payer: Self-pay

## 2021-04-24 VITALS — BP 134/100 | Ht 66.0 in | Wt 255.5 lb

## 2021-04-24 DIAGNOSIS — Z0000 Encounter for general adult medical examination without abnormal findings: Secondary | ICD-10-CM

## 2021-04-24 NOTE — Progress Notes (Signed)
[facility] initial Secretary/administrator- Patient's husband interpreted for patient. In person interpreter was not avaliable. Patient and her husband signed [place] Interpreter release form.   Clinical Measurement:  Vitals:   04/24/21 0914 04/24/21 0927  BP: (!) 132/98 (!) 134/100   Fasting Labs Drawn Today, will review with patient when they result.   Medical History:  Patient states that she does not know if she has high cholesterol, does not have high blood pressure and she does not have diabetes.  Medications:  Patient states that she does not take medication to lower cholesterol, blood pressure or blood sugar.  Patient does not take an aspirin a day to help prevent a heart attack or stroke.    Blood pressure, self measurement:  :  Patient states that she does not measure blood pressure from home. She checks her blood pressure N/A. She shares her readings with a health care provider: N/A.   Nutrition: Patient states that on average she eats 1 cups of fruit and 2 cups of vegetables per day. Patient states that she does not eat fish at least 2 times per week. Patient eats less than half servings of whole grains. Patient drinks less than 36 ounces of beverages with added sugar weekly: yes. Patient is currently watching sodium or salt intake: no. In the past 7 days patient has consumed drinks containing alcohol on 0 days. On a day that patient consumes drinks containing alcohol on average 0 drinks are consumed.      Physical activity:  Patient states that she gets 360 minutes of moderate and 360 minutes of vigorous physical activity each week.  Smoking status:  Patient states that she has has never smoked .   Quality of life:  Over the past 2 weeks patient states that she had little interest or pleasure in doing things: not at all. She has been feeling down, depressed or hopeless:not at all.    Risk reduction and counseling:   Health Coaching: Spoke with patient about the daily  recommendation for fruits and vegetables (2 cups fruit, 2 cups vegetables). Spoke with patient about practicing a heart healthy diet.  Gave examples for heart healthy fish that patient can try adding in diet (salmon, tuna, mackerel, sardines or sea bass). Example what whole grains are and different types that can be added in diet (whole wheat bread, whole grain cereals, brown rice, oatmeal or whole wheat pasta). Encouraged patient to start monitoring salt intake. Patient stated that she likes salt and adds salt to food while preparing meals as well as before eating. Explained that too much salt intake can elevate blood pressure. Patient states that she has been working out at least 6 days a week for around 2 hours. Patient states that she has lost around 18 pounds since starting to work out.    Navigation:  I will notify patient of lab results.  Patient is aware of 2 more health coaching sessions and a follow up. Will refer patient to Internal Medicine for elevated BP.  Time: 20 minutes

## 2021-04-25 LAB — LIPID PANEL
Chol/HDL Ratio: 5.7 ratio — ABNORMAL HIGH (ref 0.0–4.4)
Cholesterol, Total: 166 mg/dL (ref 100–199)
HDL: 29 mg/dL — ABNORMAL LOW (ref >39)
LDL Chol Calc (NIH): 112 mg/dL — ABNORMAL HIGH (ref 0–99)
Triglycerides: 139 mg/dL (ref 0–149)
VLDL Cholesterol Cal: 25 mg/dL (ref 5–40)

## 2021-04-25 LAB — HEMOGLOBIN A1C
Est. average glucose Bld gHb Est-mCnc: 120 mg/dL
Hgb A1c MFr Bld: 5.8 % — ABNORMAL HIGH (ref 4.8–5.6)

## 2021-04-25 LAB — GLUCOSE, RANDOM: Glucose: 97 mg/dL (ref 65–99)

## 2021-04-30 ENCOUNTER — Telehealth: Payer: Self-pay

## 2021-04-30 NOTE — Telephone Encounter (Signed)
Health coaching 2   Labs- 166 cholesterol, 112 LDL cholesterol, 139 triglycerides, 29 HDL cholesterol, 5.8 hemoglobin A1C, 97 mean plasma glucose. Patient understands and is aware of her lab results.   Goals-  1. Reduce the amount of fried and fatty foods consumed. Try to grill, bake, broil or sautee foods instead.  2. Increase the amount of whole grains consumed (whole wheat bread or pasta, brown rice, whole grain cereals or oatmeal. 3. Increase the amount of heart healthy fish consumed. 4. Watch the amount of sweets and sugars consumed.  Continue with daily exercise.    Navigation:  Patient is aware of 1 more health coaching sessions and a follow up. Patient is scheduled for follow-up with Internal Medicine on Wednesday, May 11 @ 9:15 am for follow-up for elevated labs and bloop pressure.  Time- 10 minutes

## 2021-05-02 ENCOUNTER — Ambulatory Visit: Payer: No Typology Code available for payment source | Admitting: *Deleted

## 2021-05-02 ENCOUNTER — Ambulatory Visit
Admission: RE | Admit: 2021-05-02 | Discharge: 2021-05-02 | Disposition: A | Discharge status: Home / Self Care | Payer: No Typology Code available for payment source | Source: Ambulatory Visit | Attending: Obstetrics and Gynecology | Admitting: Obstetrics and Gynecology

## 2021-05-02 ENCOUNTER — Other Ambulatory Visit: Payer: Self-pay

## 2021-05-02 VITALS — BP 142/98 | Wt 252.6 lb

## 2021-05-02 DIAGNOSIS — Z1239 Encounter for other screening for malignant neoplasm of breast: Secondary | ICD-10-CM

## 2021-05-02 NOTE — Progress Notes (Signed)
Ms. Demetri Kerman is a 45 y.o. female who presents to [facility] clinic today with no complaints.    Pap Smear: Pap smear not completed today. Last Pap smear was 02/25/2021 at the free cervical cancer screening clinic and was normal with negative HPV. Per patient has no history of an abnormal Pap smear. Last Pap smear result is available in Epic.   Physical exam: Breasts Breasts symmetrical. No skin abnormalities bilateral breasts. No nipple retraction bilateral breasts. No nipple discharge bilateral breasts. No lymphadenopathy. No lumps palpated bilateral breasts. No complaints of pain or tenderness on exam.      Pelvic/Bimanual Pap is not indicated today per BCCCP guidelines.   Smoking History: Patient has never smoked.   Patient Navigation: Patient education provided. Access to services provided for patient through [employer] program. Arabic interpreter Engineer, mining from [employer] provided.    Breast and Cervical Cancer Risk Assessment: Patient does not have family history of breast cancer, known genetic mutations, or radiation treatment to the chest before age 96. Patient does not have history of cervical dysplasia, immunocompromised, or DES exposure in-utero.  Risk Assessment    Risk Scores      05/02/2021   Last edited by: Royston Bake, CMA   5-year risk: 0.8 %   Lifetime risk: 10.8 %          A: BCCCP exam without pap smear No complaints.  P: Referred patient to the [facility] for a screening mammogram on the mobile unit. Appointment scheduled Thursday, May 02, 2021 at 1120.  Loletta Parish, RN 05/02/2021 10:56 AM

## 2021-05-02 NOTE — Patient Instructions (Signed)
Explained breast self awareness with Kyle Kolenovic. Patient did not need a Pap smear today due to last Pap smear and HPV typing was 02/25/2021. Let her know BCCCP will cover Pap smears and HPV typing every 5 years unless has a history of abnormal Pap smears. Referred patient to the [facility] for a screening mammogram on the mobile unit. Appointment scheduled Thursday, May 02, 2021 at 1120. Patient escorted to the mobile unit following BCCCP appointment for her screening mammogram. Let [facility] know [facility] will follow up with her within the next couple weeks with results of her mammogram by letter or phone. Lindsey Cunningham verbalized understanding.  Larue Drawdy, Arvil Chaco, RN 10:56 AM

## 2021-05-08 ENCOUNTER — Encounter: Payer: Self-pay | Admitting: Internal Medicine

## 2021-05-08 ENCOUNTER — Ambulatory Visit (INDEPENDENT_AMBULATORY_CARE_PROVIDER_SITE_OTHER): Payer: Self-pay | Admitting: Internal Medicine

## 2021-05-08 DIAGNOSIS — E7800 Pure hypercholesterolemia, unspecified: Secondary | ICD-10-CM | POA: Insufficient documentation

## 2021-05-08 DIAGNOSIS — R7303 Prediabetes: Secondary | ICD-10-CM

## 2021-05-08 DIAGNOSIS — R030 Elevated blood-pressure reading, without diagnosis of hypertension: Secondary | ICD-10-CM

## 2021-05-08 DIAGNOSIS — I10 Essential (primary) hypertension: Secondary | ICD-10-CM | POA: Insufficient documentation

## 2021-05-08 NOTE — Assessment & Plan Note (Signed)
BP Readings from Last 3 Encounters:  05/08/21 129/72  05/02/21 (!) 142/98  04/24/21 (!) 134/100   Patient states that she was told at her well woman visit that she had elevated blood pressure recommended to decrease her salt intake, begin exercising more often and work on weight loss.  She has been working on these things including going to the gym for the last 2 months and cutting out carbs.  No previous diagnosis of hypertension as far she knows.  Assessment/plan: Blood pressure today is significantly improved and likely secondary to patient's work on diet and exercise.  Recommended she continue working on cutting down on salt products.  Medication therapy not indicated at this time.

## 2021-05-08 NOTE — Patient Instructions (Signed)
It was nice seeing you today! Thank you for choosing [facility] for your Primary Care.    Today we talked about:   1. Cholesterol, Blood Sugar and Blood Pressure: You are doing great on improving all of these numbers by working on your diet and exercising!!  a. Cutting down on sugar from rice or pasta will help decrease your blood sugar b. Cutting down on salt will decrease your blood pressure c. Cutting down on red meat like lamb and increasing fish intake will help your cholesterol d. Exercise will help all of these things   Keep up the great work!! Let's follow up in 6 months to recheck labs.  -----------------------------------------------     !   [facility]  .     :      :                  !!                                                !!    6    .

## 2021-05-08 NOTE — Progress Notes (Signed)
   CC: Elevated BP and cholesterol    HPI:  [name] is a 45 y.o. with a PMHx as listed below who presents to the clinic for elevated BP and cholesterol  .   Please see the Encounters tab for problem-based Assessment & Plan regarding status of patient's acute and chronic conditions.  Past Medical History:  Diagnosis Date  . Irregular periods/menstrual cycles    Review of Systems: Review of Systems  Constitutional: Positive for weight loss (intentional). Negative for chills and fever.  Respiratory: Negative for cough and shortness of breath.   Cardiovascular: Negative for chest pain and leg swelling.  Gastrointestinal: Negative for abdominal pain, diarrhea, nausea and vomiting.   Physical Exam:  Vitals:   05/08/21 0912  BP: 129/72  Pulse: 69  SpO2: 100%  Weight: 253 lb 4.8 oz (114.9 kg)   Physical Exam Vitals and nursing note reviewed.  Constitutional:      General: She is not in acute distress.    Appearance: She is obese.  HENT:     Head: Normocephalic and atraumatic.  Cardiovascular:     Rate and Rhythm: Normal rate and regular rhythm.     Heart sounds: No murmur heard. No gallop.   Pulmonary:     Effort: Pulmonary effort is normal. No respiratory distress.     Breath sounds: No wheezing, rhonchi or rales.  Musculoskeletal:     Right lower leg: No edema.     Left lower leg: No edema.  Neurological:     General: No focal deficit present.     Mental Status: She is alert and oriented to person, place, and time. Mental status is at baseline.     Gait: Gait normal.  Psychiatric:        Mood and Affect: Mood normal.        Behavior: Behavior normal.        Thought Content: Thought content normal.        Judgment: Judgment normal.    Assessment & Plan:   See Encounters Tab for problem based charting.  Patient discussed with Dr. Jimmye Norman

## 2021-05-08 NOTE — Assessment & Plan Note (Signed)
Lab Results  Component Value Date   HGBA1C 5.8 (H) 04/24/2021   Patient has been working on decreasing carb intake.  She denies any polyuria or polydipsia.  Assessment/plan: Patient is minimally in the prediabetic range at 5.8%.  Recommended she continue working on dietary changes as she has been and commended her on her weight loss.  -Recheck A1c in 6 months

## 2021-05-08 NOTE — Assessment & Plan Note (Signed)
Lab Results  Component Value Date   CHOL 166 04/24/2021   HDL 29 (L) 04/24/2021   LDLCALC 112 (H) 04/24/2021   TRIG 139 04/24/2021   CHOLHDL 5.7 (H) 04/24/2021   Patient states she was informed she has elevated bad cholesterol and low good cholesterol.  She was instructed to eat more fish products.  She denies any known family history of hyperlipidemia.  Assessment/plan: The 10-year ASCVD risk score Mikey Bussing DC Brooke Bonito., et al., 2013) is: 1.5%   Values used to calculate the score:     Age: 45 years     Sex: Female     Is Non-Hispanic African American: No     Diabetic: No     Tobacco smoker: No     Systolic Blood Pressure: 801 mmHg     Is BP treated: No     HDL Cholesterol: 29 mg/dL     Total Cholesterol: 166 mg/dL  Minimal ASCVD risk and no indication at this time for medication management.  We discussed dietary changes she can make including reducing lamb products and increasing fish.  - Recheck lipid panel in 6 months - No indication for medication therapy at this time

## 2021-05-12 NOTE — Progress Notes (Signed)
Internal Medicine Clinic Attending  Case discussed with Dr. Basaraba  At the time of the visit.  We reviewed the resident's history and exam and pertinent patient test results.  I agree with the assessment, diagnosis, and plan of care documented in the resident's note.  

## 2021-07-02 ENCOUNTER — Encounter: Payer: Self-pay | Admitting: *Deleted

## 2021-08-23 ENCOUNTER — Encounter: Payer: Self-pay | Admitting: Family Medicine

## 2021-08-23 ENCOUNTER — Ambulatory Visit (INDEPENDENT_AMBULATORY_CARE_PROVIDER_SITE_OTHER): Payer: Self-pay | Admitting: Family Medicine

## 2021-08-23 ENCOUNTER — Other Ambulatory Visit: Payer: Self-pay

## 2021-08-23 VITALS — BP 139/89 | HR 71 | Wt 254.5 lb

## 2021-08-23 DIAGNOSIS — N939 Abnormal uterine and vaginal bleeding, unspecified: Secondary | ICD-10-CM

## 2021-08-23 LAB — POCT PREGNANCY, URINE: Preg Test, Ur: NEGATIVE

## 2021-08-23 NOTE — Patient Instructions (Signed)
Preventive Care 21-44 Years Old, Female Preventive care refers to lifestyle choices and visits with your health care provider that can promote health and wellness. This includes: A yearly physical exam. This is also called an annual wellness visit. Regular dental and eye exams. Immunizations. Screening for certain conditions. Healthy lifestyle choices, such as: Eating a healthy diet. Getting regular exercise. Not using drugs or products that contain nicotine and tobacco. Limiting alcohol use. What can I expect for my preventive care visit? Physical exam Your health care provider may check your: Height and weight. These may be used to calculate your BMI (body mass index). BMI is a measurement that tells if you are at a healthy weight. Heart rate and blood pressure. Body temperature. Skin for abnormal spots. Counseling Your health care provider may ask you questions about your: Past medical problems. Family's medical history. Alcohol, tobacco, and drug use. Emotional well-being. Home life and relationship well-being. Sexual activity. Diet, exercise, and sleep habits. Work and work environment. Access to firearms. Method of birth control. Menstrual cycle. Pregnancy history. What immunizations do I need?  Vaccines are usually given at various ages, according to a schedule. Your health care provider will recommend vaccines for you based on your age, medicalhistory, and lifestyle or other factors, such as travel or where you work. What tests do I need?  Blood tests Lipid and cholesterol levels. These may be checked every 5 years starting at age 20. Hepatitis C test. Hepatitis B test. Screening Diabetes screening. This is done by checking your blood sugar (glucose) after you have not eaten for a while (fasting). STD (sexually transmitted disease) testing, if you are at risk. BRCA-related cancer screening. This may be done if you have a family history of breast, ovarian, tubal, or  peritoneal cancers. Pelvic exam and Pap test. This may be done every 3 years starting at age 21. Starting at age 30, this may be done every 5 years if you have a Pap test in combination with an HPV test. Talk with your health care provider about your test results, treatment options,and if necessary, the need for more tests. Follow these instructions at home: Eating and drinking  Eat a healthy diet that includes fresh fruits and vegetables, whole grains, lean protein, and low-fat dairy products. Take vitamin and mineral supplements as recommended by your health care provider. Do not drink alcohol if: Your health care provider tells you not to drink. You are pregnant, may be pregnant, or are planning to become pregnant. If you drink alcohol: Limit how much you have to 0-1 drink a day. Be aware of how much alcohol is in your drink. In the [place] one drink equals one 12 oz bottle of beer (355 mL), one 5 oz glass of wine (148 mL), or one 1 oz glass of hard liquor (44 mL).  Lifestyle Take daily care of your teeth and gums. Brush your teeth every morning and night with fluoride toothpaste. Floss one time each day. Stay active. Exercise for at least 30 minutes 5 or more days each week. Do not use any products that contain nicotine or tobacco, such as cigarettes, e-cigarettes, and chewing tobacco. If you need help quitting, ask your health care provider. Do not use drugs. If you are sexually active, practice safe sex. Use a condom or other form of protection to prevent STIs (sexually transmitted infections). If you do not wish to become pregnant, use a form of birth control. If you plan to become pregnant, see your health care   provider for a prepregnancy visit. Find healthy ways to cope with stress, such as: Meditation, yoga, or listening to music. Journaling. Talking to a trusted person. Spending time with friends and family. Safety Always wear your seat belt while driving or riding in a  vehicle. Do not drive: If you have been drinking alcohol. Do not ride with someone who has been drinking. When you are tired or distracted. While texting. Wear a helmet and other protective equipment during sports activities. If you have firearms in your house, make sure you follow all gun safety procedures. Seek help if you have been physically or sexually abused. What's next? Go to your health care provider once a year for an annual wellness visit. Ask your health care provider how often you should have your eyes and teeth checked. Stay up to date on all vaccines. This information is not intended to replace advice given to you by your health care provider. Make sure you discuss any questions you have with your healthcare provider. Document Revised: 08/12/2020 Document Reviewed: 08/26/2018 Elsevier Patient Education  2022 [employer].

## 2021-08-23 NOTE — Progress Notes (Signed)
GYNECOLOGY OFFICE VISIT NOTE  History:   Lindsey Cunningham is a 45 y.o. [id number] here today for follow up of abnormal uterine bleeding.  Patient seen with assistance of video interpreter.  Patient last seen by myself on 01/25/2021 for abnormal uterine bleeding Had increase in quantity and duration Recommended EMB and return to use of hormonal IUD which she had used in the past Due to lack of insurance referred to [facility] for these interventions  Today reports bleeding has been ongoing and getting worse Bleeding for long periods of time Medication did not work at all  Up to date on both pap and mammo  Health Maintenance Due  Topic Date Due   COVID-19 Vaccine (1) Never done   Hepatitis C Screening  Never done   TETANUS/TDAP  Never done   INFLUENZA VACCINE  07/29/2021    Past Medical History:  Diagnosis Date   Irregular periods/menstrual cycles     Past Surgical History:  Procedure Laterality Date   CESAREAN SECTION      The following portions of the patient's history were reviewed and updated as appropriate: allergies, current medications, past family history, past medical history, past social history, past surgical history and problem list.   Health Maintenance:   Last pap: Lab Results  Component Value Date   DIAGPAP  02/25/2021    - Negative for intraepithelial lesion or malignancy (NILM)   [place] Negative 02/25/2021     Last mammogram:  05/02/2021, BIRADS-1    Review of Systems:  Pertinent items noted in HPI and remainder of comprehensive ROS otherwise negative.  Physical Exam:  BP 139/89   Pulse 71   Wt 254 lb 8 oz (115.4 kg)   LMP  (LMP Unknown) Comment: Patient stated that she bleeds everyday  BMI 41.08 kg/m  CONSTITUTIONAL: Well-developed, well-nourished female in no acute distress.  HEENT:  Normocephalic, atraumatic. External right and left ear normal. No scleral icterus.  NECK: Normal range of motion, supple, no masses noted on observation SKIN: No  rash noted. Not diaphoretic. No erythema. No pallor. MUSCULOSKELETAL: Normal range of motion. No edema noted. NEUROLOGIC: Alert and oriented to person, place, and time. Normal muscle tone coordination.  PSYCHIATRIC: Normal mood and affect. Normal behavior. Normal judgment and thought content. RESPIRATORY: Effort normal, no problems with respiration noted   Labs and Imaging No results found for this or any previous visit (from the past 168 hour(s)). No results found.    Assessment and Plan:   Problem List Items Addressed This Visit       Genitourinary   Abnormal uterine bleeding (AUB) - Primary    Ongoing AUB which has worsened, not responsive to PO progesterone. Again recommended EMB and pelvic US to further evaluate and rule out endometrial abnormalities, she is amenable with this plan and has already filed for [employer] financial aid. Unfortunately can not exclude pregnancy today based on timing of LMP and intercourse, will reschedule in a few weeks and advised no unprotected intercourse in the mean time. Also discussed hormonal IUD for treatment since PO meds not effective, we will fill out a Mirena scholarship form today. Advised that it will take several weeks to months to reach full effect and that she may need surgical referral if this fails.       Relevant Orders   US Pelvis Complete    Routine preventative health maintenance measures emphasized. Please refer to After Visit Summary for other counseling recommendations.   Return in about 4 weeks (around  09/20/2021) for endometrial biopsy, possible IUD insertion.    Total face-to-face time with patient: 15 minutes.  Over 50% of encounter was spent on counseling and coordination of care.   Clarnce Flock, MD/MPH Attending Family Medicine Physician, [facility] for [facility], [place]

## 2021-08-23 NOTE — Progress Notes (Signed)
Patient is here because she reports heavy bleeding during periods. She also mentioned that she would pass a lot of clots during this time.

## 2021-08-23 NOTE — Assessment & Plan Note (Signed)
Ongoing AUB which has worsened, not responsive to PO progesterone. Again recommended EMB and pelvic US to further evaluate and rule out endometrial abnormalities, she is amenable with this plan and has already filed for [employer] financial aid. Unfortunately can not exclude pregnancy today based on timing of LMP and intercourse, will reschedule in a few weeks and advised no unprotected intercourse in the mean time. Also discussed hormonal IUD for treatment since PO meds not effective, we will fill out a Mirena scholarship form today. Advised that it will take several weeks to months to reach full effect and that she may need surgical referral if this fails.

## 2021-09-16 ENCOUNTER — Ambulatory Visit
Admission: RE | Admit: 2021-09-16 | Discharge: 2021-09-16 | Disposition: A | Discharge status: Home / Self Care | Payer: No Typology Code available for payment source | Source: Ambulatory Visit | Attending: Family Medicine | Admitting: Family Medicine

## 2021-09-16 ENCOUNTER — Other Ambulatory Visit: Payer: Self-pay

## 2021-09-16 DIAGNOSIS — N939 Abnormal uterine and vaginal bleeding, unspecified: Secondary | ICD-10-CM | POA: Insufficient documentation

## 2021-09-18 ENCOUNTER — Telehealth: Payer: Self-pay | Admitting: Family Medicine

## 2021-09-18 NOTE — Telephone Encounter (Signed)
Erroneous encounter

## 2021-09-20 ENCOUNTER — Ambulatory Visit (INDEPENDENT_AMBULATORY_CARE_PROVIDER_SITE_OTHER): Payer: Self-pay | Admitting: Family Medicine

## 2021-09-20 ENCOUNTER — Encounter: Payer: Self-pay | Admitting: Family Medicine

## 2021-09-20 ENCOUNTER — Other Ambulatory Visit: Payer: Self-pay

## 2021-09-20 VITALS — BP 141/88 | HR 67 | Ht 67.0 in | Wt 256.5 lb

## 2021-09-20 DIAGNOSIS — Z3202 Encounter for pregnancy test, result negative: Secondary | ICD-10-CM

## 2021-09-20 DIAGNOSIS — N939 Abnormal uterine and vaginal bleeding, unspecified: Secondary | ICD-10-CM

## 2021-09-20 LAB — POCT PREGNANCY, URINE: Preg Test, Ur: NEGATIVE

## 2021-09-20 MED ORDER — MISOPROSTOL 200 MCG PO TABS: ORAL_TABLET | ORAL | 0 refills | Status: DC

## 2021-09-20 MED ORDER — OXYCODONE-ACETAMINOPHEN 5-325 MG PO TABS: ORAL_TABLET | ORAL | 0 refills | Status: DC

## 2021-09-20 NOTE — Progress Notes (Signed)
GYNECOLOGY OFFICE VISIT NOTE  History:   Lindsey Cunningham is a 45 y.o. [id number] here today for follow up of AUB.  Seen 08/23/21 after long interval of not seeing her Had ongoing AUB not responsive to PO progesterone Sent for [place] to further evaluate EMB was to be performed but could not exclude pregnancy so rescheduled Also discussed IUD US obtained 09/16/21 and showed endometrial strip 5.7 mm, a few subserosal small fibroids, simple R ovarian cyst  Today reports she has ongoing bleeding Would like more definitive management Not interested in oral medications Not interested in IUD  Health Maintenance Due  Topic Date Due   COVID-19 Vaccine (1) Never done   Hepatitis C Screening  Never done   TETANUS/TDAP  Never done   INFLUENZA VACCINE  07/29/2021    Past Medical History:  Diagnosis Date   Irregular periods/menstrual cycles     Past Surgical History:  Procedure Laterality Date   CESAREAN SECTION      The following portions of the patient's history were reviewed and updated as appropriate: allergies, current medications, past family history, past medical history, past social history, past surgical history and problem list.   Health Maintenance:   Last pap: Lab Results  Component Value Date   DIAGPAP  02/25/2021    - Negative for intraepithelial lesion or malignancy (NILM)   [place] Negative 02/25/2021    Last mammogram:  05/02/2021 BIRADS-1   Review of Systems:  Pertinent items noted in HPI and remainder of comprehensive ROS otherwise negative.  Physical Exam:  BP (!) 141/88   Pulse 67   Ht 5\' 7"  (1.702 m)   Wt 256 lb 8 oz (116.3 kg)   LMP 09/13/2021 (Approximate) Comment: Patient stated that she bleeds everyday  BMI 40.17 kg/m  CONSTITUTIONAL: Well-developed, well-nourished female in no acute distress.  HEENT:  Normocephalic, atraumatic. External right and left ear normal. No scleral icterus.  NECK: Normal range of motion, supple, no masses noted on  observation SKIN: No rash noted. Not diaphoretic. No erythema. No pallor. MUSCULOSKELETAL: Normal range of motion. No edema noted. NEUROLOGIC: Alert and oriented to person, place, and time. Normal muscle tone coordination.  PSYCHIATRIC: Normal mood and affect. Normal behavior. Normal judgment and thought content. RESPIRATORY: Effort normal, no problems with respiration noted ABDOMEN: No masses noted. No other overt distention noted.   PELVIC: Normal appearing external genitalia; normal appearing vaginal mucosa and cervix.  No abnormal discharge noted.  Labs and Imaging Results for orders placed or performed in visit on 09/20/21 (from the past 168 hour(s))  Pregnancy, urine POC   Collection Time: 09/20/21  9:54 AM  Result Value Ref Range   Preg Test, Ur NEGATIVE NEGATIVE   US Pelvis Complete  Result Date: 09/16/2021 CLINICAL DATA:  Initial evaluation for abnormal uterine bleeding. EXAM: TRANSABDOMINAL ULTRASOUND OF PELVIS TECHNIQUE: Transabdominal ultrasound examination of the pelvis was performed including evaluation of the uterus, ovaries, adnexal regions, and pelvic cul-de-sac. COMPARISON:  Prior ultrasound from 05/23/2010. FINDINGS: Uterus Measurements: 10.3 x 4.2 x 6.9 cm = volume: 154.2 mL. Uterus is anteverted. 2.0 x 1.5 x 2.6 cm subserosal fibroid present at the mid posterior uterine body. 1.8 x 1.1 x 1.9 cm subserosal fibroid present at the right posterior uterine body. 2.1 x 1.4 x 2.2 cm subserosal fibroid present at the left posterior uterine body. Endometrium Thickness: 5.7 mm.  No focal abnormality visualized. Right ovary Measurements: 5.9 x 3.9 x 2.9 cm = volume: 35.0 mL. 4.1 x 3.6 x 3.1  cm simple cyst. No significant internal complexity. No vascularity or solid component. Left ovary Measurements: 2.9 x 1.6 x 1.9 cm = volume: 4.7 mL. Normal appearance/no adnexal mass. Other findings:  No abnormal free fluid. IMPRESSION: 1. Endometrial stripe measures 5.7 mm in thickness. If bleeding  remains unresponsive to hormonal or medical therapy, sonohysterogram should be considered for focal lesion work-up. (Ref: Radiological Reasoning: Algorithmic Workup of Abnormal Vaginal Bleeding with Endovaginal Sonography and Sonohysterography. AJR 2008; [id number]). 2. Underlying fibroid uterus as detailed above. 3. 4.1 cm simple right ovarian cyst, benign in appearance. No follow up imaging recommended. Note: This recommendation does not apply to premenarchal patients or to those with increased risk (genetic, family history, elevated tumor markers or other high-risk factors) of ovarian cancer. Reference: Radiology 2019 Nov; 293(2):359-371. 4. Normal left ovary.  No other adnexal mass or free fluid. Electronically Signed   By: Jeannine Boga M.D.   On: 09/16/2021 20:49      Assessment and Plan:   Problem List Items Addressed This Visit       Genitourinary   Abnormal uterine bleeding (AUB) - Primary    EMB attempted today as part of AUB workup, unfortunately unable to dilate os and procedure was abandoned. US unremarkable. Desires surgical management of this issue, declines PO meds or IUD. Will refer to GYN partner for further discussion. Rx sent for misoprostol and percocet for follow up visit.       Relevant Medications   misoprostol (CYTOTEC) 200 MCG tablet   oxyCODONE-acetaminophen (PERCOCET/ROXICET) 5-325 MG tablet    Routine preventative health maintenance measures emphasized. Please refer to After Visit Summary for other counseling recommendations.   Return in about 4 weeks (around 10/18/2021) for to discuss surgical options for AUB.    Total face-to-face time with patient: 20 minutes.  Over 50% of encounter was spent on counseling and coordination of care.   Clarnce Flock, MD/MPH Attending Family Medicine Physician, [facility] for [facility], [place]

## 2021-09-20 NOTE — Progress Notes (Signed)
    GYNECOLOGY CLINIC ENDOMETRIAL BIOPSY PROCEDURE NOTE  Lindsey Cunningham is a y 45 y.o. [id number] here for endometrial biopsy for abnormal uterine bleeding.  Discussed nature of the procedure and risks and benefits.  Pregnancy test:  Lab Results  Component Value Date   PREGTESTUR NEGATIVE 09/20/2021    No Known Allergies  Patient given informed consent, signed copy in the chart, time out was performed.    The patient was placed in the lithotomy position and the cervix brought into view with sterile speculum.  Cervix cleansed x 2 with betadine swabs. A tenaculum was placed in the anterior lip of the cervix. Attempted to pass EMB pipette through os but was unable to. Subsequently attempted multiple time to dilate os with os finder, successful with #2 but subsequently unable to pass EMB. Procedure abandoned, plan for re-attempt at next visit and patient was given rx for misoprostol and pain medications in anticipation of next visit.   Clarnce Flock, MD/MPH Attending Family Medicine Physician, [facility] for [facility], [place]

## 2021-09-20 NOTE — Assessment & Plan Note (Addendum)
EMB attempted today as part of AUB workup, unfortunately unable to dilate os and procedure was abandoned. US unremarkable. Desires surgical management of this issue, declines PO meds or IUD. Will refer to GYN partner for further discussion. Rx sent for misoprostol and percocet for follow up visit.

## 2021-09-28 ENCOUNTER — Encounter: Payer: Self-pay | Admitting: Radiology

## 2021-10-09 ENCOUNTER — Ambulatory Visit (INDEPENDENT_AMBULATORY_CARE_PROVIDER_SITE_OTHER): Payer: Self-pay | Admitting: Obstetrics and Gynecology

## 2021-10-09 ENCOUNTER — Other Ambulatory Visit: Payer: Self-pay

## 2021-10-09 ENCOUNTER — Encounter: Payer: Self-pay | Admitting: Obstetrics and Gynecology

## 2021-10-09 ENCOUNTER — Other Ambulatory Visit (HOSPITAL_COMMUNITY)
Admission: RE | Admit: 2021-10-09 | Discharge: 2021-10-09 | Disposition: A | Discharge status: Home / Self Care | Payer: No Typology Code available for payment source | Source: Ambulatory Visit | Attending: Obstetrics and Gynecology | Admitting: Obstetrics and Gynecology

## 2021-10-09 VITALS — BP 145/77 | HR 72 | Wt 257.0 lb

## 2021-10-09 DIAGNOSIS — N840 Polyp of corpus uteri: Secondary | ICD-10-CM

## 2021-10-09 DIAGNOSIS — N939 Abnormal uterine and vaginal bleeding, unspecified: Secondary | ICD-10-CM | POA: Insufficient documentation

## 2021-10-09 DIAGNOSIS — Z3202 Encounter for pregnancy test, result negative: Secondary | ICD-10-CM

## 2021-10-09 LAB — POCT PREGNANCY, URINE: Preg Test, Ur: NEGATIVE

## 2021-10-09 NOTE — Progress Notes (Signed)
Ms Lindsey Cunningham presents for [facility] due to DUB and uterine fibroids. Also to discuss surgery. Pt desires definite therapy. Cycles for last several months have been irregular, heavy with clots. She has tried several homronal medications in the past including an IUD with relief. GYN U/S several small fibroids < 2 cm, vol 154 gms. H/O TSVD x 2, ( largest 7 # 8 oz) H/O LTCS x 1. Pap smear 2/22 normal. Sexual active without problems.  PE AF VSS Lungs clear Heart RRR Abd soft + BS GU nl EGBUS small < 10 weeks mobile no masses or tenderness.  ENDOMETRIAL BIOPSY     The indications for endometrial biopsy were reviewed.   Risks of the biopsy including cramping, bleeding, infection, uterine perforation, inadequate specimen and need for additional procedures  were discussed. The patient states she understands and agrees to undergo procedure today. Consent was signed. Time out was performed. Urine HCG was negative. During the pelvic exam, the cervix was prepped with Betadine. A single-toothed tenaculum was placed on the anterior lip of the cervix to stabilize it. The 3 mm pipelle was introduced into the endometrial cavity without difficulty to a depth of 9 cm, and a moderate amount of tissue was obtained and sent to pathology. The instruments were removed from the patient's vagina. Minimal bleeding from the cervix was noted. The patient tolerated the procedure well.    A/P DUB        Uterine fibroids  Discussed vaginal hysterotomy. R/B/Post op care reviewed with pt. Information provided. Pt is considering Laparoscopic hysterectomy. Pt to review information provided. Will let me know which route of surgery pt desires. If desires Lap with refer to one of my partners . F/U EMBX as indicated otherwise F/U PRN

## 2021-10-09 NOTE — Patient Instructions (Signed)
??????? ??? ??????? ????? ??? ?????? Vaginal Hysterectomy, Care After ????????? ??????? ??????? ??????? ?????? ??? ?????? ???????. ????? ????? ???? ??????? ?????? ????? ??????? ???? ???????. ???? ???? ????? ?????? ?? ?????? ??????? ??????? ????? ??????? ?????? ????? ???. ???? ?????? ??? ??????? ???????? ?? ?????? ???? ?? ??? ??? ??????? ???????: ?????? ???? ???? ?????? ???????. ???? ????? ?????? ???? ??? ??? ?????? ????. ???????? ??? ??????? ??? ???? (????? ??????) ??? ??? ???????. ????? ???? ??????? (?????). ?????? ????? ?? ?????? ???????. ????? (?????). ??? ??????. ?????? ?????? ?? ?????? ?????. ?????? ??????? ?? ???????? ????. ??? ??? ????? ??????? ?????? ??? ?????? ????? ?? ???? ????? ?????? ????? ??? ??? ??????? ????? ?????? ???? ????? (?????). ????? ????????? ??????? ?? ???????: ???????  ?????? ??????? ??? ??????? ???? ??????? ??????? ????? ????? ????? ???? ?? ??? ???? ????. ?? ??????? ???????? ?? ?????? ???????? ????????????? (NSAIDs)? ??? ????????????. ?? ????? ??? ??????? ?? ???? ????. ????? ???? ??????? ?????? ??? ??? ??? ?????? ??????? ???: ???? ????? ???? ??????? ?? ??????? ?????????. ???? ?? ???? ???????. ????? ??????? ??? ????? ??? ????????? ??????? ?? ???????? ?? ????? ?? ???? ?????: ????? ????? ????? ?? ??????? ???? ??? ???? ???? ??????. ?????? ????? ????? ??? ???? ???? ?? ????? ????. ?????? ??????? ?????? ???????? ??? ????????? ??????? ??????? ???????? ?????????? ???????. ???? ????? ??????? ???? ????? ??? ??? ????? ?? ?????? ????????? ??????? ??? ??????? ??????? ?? ????????. ??????  ????? ??? ??? ??? ?? ??????? ??? ??????? ???? ??????? ?????? ??????? ???. ????? ?????? ??????? ??????? ??? ??????? ??????? ??????? ??????. ????? ???? ??????? ?????? ?? ??????? ?????? ???. ???? ?????? ???? ????? ??? ????. ????? ????? ????? ????? ?? ???? ?? ??????. ???? ??? ??? ?????? ???? ????? ???????. ????? ???????? ??? ???? ?????? ?? ??? ???????. ????? ?? ???? ??????? ??? ?? ?????? ????  ????? ??? ??????? ????????? ?? ??????? ???????. ?? ???? ????? ???? ???? ?? 10 ??? (4.5 ???)? ?? ????? ???? ??????? ??? ??? ?? ????? ??????? ??????? ?????? ??????? ?? ?? ??? ?????. ???? ???? ?? ?????? ?????? ?? ????? ???????? ??? ????? ?????? ????? ???? ?????. ?? ?????? ??????? ?? ??????? ?????? ??? ?? ?????? ???? ??????? ?????? ???? ???????? ??? ?????. ??? ?????? ?? ??????? ?????? ????? ??? ????????? ?? ?????. ????? ??? ??????? ???? ????? ?????? ??????? ???????????? ??? ??????? ???????????. ??? ???? ??? ??????? ??? ???????. ??????? ???? ??????? ?????? ??? ???? ????? ??? ???????? ??????? ?? ???????. ?? ??????? ?????? ??? ?? ????? ????? ??????? ?????? ??? ???. ??????? ???? ?? ??????? ????? ?????? ?? ???????? ???????? ?? ?????? ????? ???? 6 ?????? ??? ?????? ?? ??? ??????? ???? ??????? ?????? ??????? ??. ??? ??? ?????? ?? ?????? ????? ?? ?????? ??? ???????? ?????? ?? ???? ??????? ?????? ????? ?? ?? ???????. ????? ????? ???? ?????? ????? ?????? ??? ?????? ??? ???????. ?? ?????? ?? ????? ?? ??????? ???? ???? ??? ??? ?????? ????? ??????? ??????. ????? ???? ?? ?? ???? ??? ??? ?????????? ??? ???? ???? ??????? ?? 3 ??????. ????? ????? ????? ??? ??????? ???? ??????? ??????. ????? ??? ??????? ??? ??????? ?? ??????? ??????? ?????? ?????? ?? ??????. ?????? ????? ?????? ????????. ???? ??? ???. ????? ?????? ??????? ?????? ?? ??????? ???????: ??? ???? ????? ????? ?????. ??????? ????. ???? ??? ?? ???? ?? ?????. ?????? ?????. ???? ?? ?? ???? ?? ??????? ????? ??????? ?? ??????? ????? ?? ????? ??? ???????. ??????? ?? ?????? ????? ?? ?????? ??? 3-5 ???? ?? ???????. ????? ???????? ????? ?? ??????? ???????: ?????? ????? ??? ?? ???? ?? ????. ??????? ???????. ???? ???? ???? ???? ?? ????? ?? ?????? ??????? ???? ?????? ????? ?????? ??? ??? ?? ????. ??????? ???? ?? ?????? ?? ??? ??????. ?????? ??????? ?? ???? ???? ?? ?????? ?? ???? ???????. ???? ???? ??? ??????? ??? ????? ????? ????? ???? ???? ?????. ?? ?????? ???? ??  ??? ???? ??????? ????? ?? ??. ??? ????? ??? ???????? ?????? ??? ?????. ????? ?????? ??????? ??????? (  911 ?? ???????? ??????? ?????????). ?? ????? ??????? ?????? ??? ????????. ???? ??? ???????? ?? ?????? ?? ????? ?? ??? ?? ???? ????? ?? ????? ?? ????? ????? ?? ????? ???????? ?? ????? ????? ?? ???????? ????. ?????? ??????? ??? ??????? ???? ??????? ??????? ????? ????? ????? ???? ?? ??? ???? ????. ????? ??? ??? ??? ?? ??????? ??? ??????? ???? ??????? ?????? ??????? ???. ????? ?????? ??????? ??????? ??? ??????? ??????? ??????? ??????. ????? ???????? ??????? ?????? ??? ?? ??? ???? ????? ??????? ?? ??? ??? ?????? ?? ??? ?? ???? ?? ????? ?? ?????? ???? ????? ??? ???????. ????? ??? ???????? ??? ????? ??? ??? ?????? ?? ??? ???? ?? ???? ?? ????? ?? ??? ??? ?????? ?? ???????? ?? ?? ???? ????? ?? ?????? ?? ??? ?? ????? ?? ??? ?? ??????. ??? ????? ?? ??? ????????? ?? ???? ?????? ????????? ???? ?????? ???? ??????? ??????. ???? ?? ?????? ??? ????? ???? ?? ???? ?? ???? ??????? ??????.? Document Revised: 10/09/2020 Document Reviewed: 10/01/2020 Elsevier Patient Education  [place]. ??????? ??? ??????? ??????? Hysterectomy Information ??????? ????? ?????? ???? ???? ????? ?????. ????? ????? ????? ???? ??? ?? ????? (??? ?????)? ???? ???? ?? ??????. ???? ?? ???? ???? ????? ???? ?????? ?? ????????? ?? ?????? ?? ??? ???????. ???? ?? ????? ??? ??????? ????? ?????? ?? ???????? ??????. ????? ???? ?????? ???? ??????? ?????? ????????? ?????? ??? ?????? ????? ?? ????????. ??? ???? ??? ???????? ???? ?? ??? ?????? ?????? ??? ??? ???? ????? ??? ????? (?? ???? ?????). ?? ??????? ??????? ???????? ?????? ???? ?????? ?? ??????? ???? ????? ?? ???? ????? ???? ???????. ??? ????? ?? ???: ???? ????? ??? ????? ?????. ???? ?? ??? ???? ????? (????) ?? ?????. ?????? ????? ????. ????? ??? ??? ??? ??? ????? ????? ???? ?????. ???????? ??????. ???? ??? ????? ???? ????? ????? ?? ????? ?? ???? ?????. ???? ????? ?????. ???? ???? ?????  ???? ????? ??? ????? ??? ???? ??????. ??? ??? ?????? ?? ????? (????? ????? ???????) ????? ???? ?????. ???? ????? ??? ???????. ????? ??? ????? ?? ????? ?????. ?????? (??????) ??????. ????? ??? ????? ??????? ?????? ??? ????? ????? ????????. ?? ??????? ???????? ?? ????? ??????? ?????? ???? ????? ????? ?????? ?? ????? ??????? ?????: ??????? ????? ??? ???????. ?? ??? ?????? ??????? ????? ?????? ?? ?????? ???? ??? ?????. ??????? ????? ??????. ?? ??? ?????? ??????? ????? ???? ?????. ??????? ????? ??????. ?? ??? ?????? ??????? ????? ???? ????? ???????? ???? ???? ????? ?? ????? (???????? ?????). ?? ????? ???????? ???? ???? ??? ????? ??????? ?????? ???? ?????? ?? ?????? ???? ???? ??? ????? ??????? ?????? ??? ????: ??????? ????? ??? ??????. ?? ??? ?????? ??? ????? ?? ????? ?? ?????. ????? ??????? ????? ???? ??? ????. ??????? ????? ??? ??????. ?? ??? ?????? ??? ????? ?? ????? ?? ??????. ????? ??????? ????? ???? ??? ????. ??? ???? ??? ???? ?? ?????. ??????? ?????? ????? ????????. ?? ??? ?????? ???? ??? ????? ?? ????? ???? ????? ?? ?????. ????? ????? ????? ???? ???? ???? ??????? (???????? ???????) ??? ??? ??????. ?? ???? ??? ????? ???? ??? ?????? ???????. ?????? ????? ??? ????? ?????. ???? ????? ??????? ??????? ???? ?????? ?? ???? ??????. ??????? ????? ???? ???? ??????? ??????? ??????? (LAVH). ?? ??? ?????? ???? ??? ????? ?? ????? ???? ????? ?? ?????. ?????? ??? ?? ??????? ???????? ????? ????? ?????? ????? ??? ??????. ????? ????? ????? ??? ??????. ??????? ????? ?????? ????? ??????? ????? ???????. ?? ??? ?????? ???? ????? ????? ??? ?????? ???? ???? ????? ?? ????? ???? ????? ?? ?????. ????? ??????? ???? ???? ?????? ??? ?????????? ?????? ??????? ???? ?????? ??????? ????????? ??? ?????? ?? ??????? ????????. ????? ??? ?????? ?? ????? ?? ????? ??????? ????????. ?? ???? ????? ????? ??? ????? ????? ? ?????? ??? ?????? ?? ??? ??????. ????? ???????? ?? ???? ??????? ?????? ?????? ??????? ???????? ???. ?? ????? ???  ???????? ???? ???? ??? ??????? ???. ??? ???? ??? ???? ??? ???????? ??? ?????: ??????? ????? ??????? ?????? ??? ????. ??? ????? ???? ??????? ?????? ?? ??? ??? ?????? ?? ???? ?????? ??. ???? ?????? ???? ?? ??????? ?? ?????. ??????? ?????. ??? ???? ??? ??????? ??????? ?? ???????. ??????? ?????? ???? ???????. ???????? ??????? ??? ??????? ????? ??? ????? ?? ??????? ????? ??? ??????. ?????? ???????? ??? ??????? ????? ????? ?????? ????? ?????? ??? ?????. ?? ??????? ??? ??????? ????????? ???? ?? ????? ?????????? ???????? ??? ??? ??????? ????? ???? ?????? ???. ???????? ??? ???? ??? ???? ???? ???? ?????? ?????? ???? ?????? ???  3-5 ???? ??? ?????? ??? ??????. ??? ???????? ?? ?????? ???? ???? ??? ??????? ???? 2-4 ?????? ??? ??????? ?????? ?????. ?? ???? ??????? ????????? ??????? ?????? ?????? ??? ????? ??? ???? ??????? ??????? ????? ??????. ??? ???? ?? ?????? ????????? ?????? ??????? ??? ??????? ?? ???? ???? ?? ???? ??? ???????? ??? ???? ?????? ???????? ??? ??? ????? ??????. ??? ???? ???? ??? ?? ????? ????? ??? ???????? ??? ??? ?????? ??? ????? ?????? ??????? ??? ??? ?????? ?? ??? ???? ?????? ????. ???? ??? ??? ??????? ?????? ??? ???? ??????? ?????? ?? ??????? ????? ????? ?? ??????? ??????? ?? ????? ????? ?????? ???? ???? ??????? ?? ??????? ?????? ??????? ?????? ?? ??????? ???????? ???? ????? ??? ???????? ?? ???????? ?? ???????? ?? ??? ?????? ????????? ??? ????? ???????? ?? ????? ???? ??????? ????????? ???????? ?? ??????? ???????? ??? ???????? ???? ??????? ????? ?????? ???? ???? ????? ?????. ???? ????? ?????? ????? ?? ???????? ?? ?????? ?????. ???? ?? ????? ??? ??????? ????? ?????? ?? ???????? ??????. ??? ???? ????? ??? ???????? ????? ???????? ?? ????? ????? ?????. ??? ???? ??? ???????? ???? ?? ??? ?????? ?????? ??? ??? ???? ????? ??? ?????? (?? ???? ?????). ???? ????? ????? ?? ????? ??????? ?????. ????? ???????? ?? ???? ??????? ?????? ?????? ?????? ?????? ???. ??? ??????? ???? ??? ??? ???? ???? ???  ???????. ???? ??????? ?????? ?? ?????? ?? ????? ???? ?? ??? ??????? ???????. ??? ????? ?? ??? ????????? ?? ???? ?????? ????????? ???? ?????? ???? ??????? ??????. ???? ?? ?????? ??? ????? ???? ?? ???? ?? ???? ??????? ??????.? Document Revised: 01/18/2020 Document Reviewed: 01/18/2020 Elsevier Patient Education  2021 [employer].

## 2021-10-11 LAB — SURGICAL PATHOLOGY

## 2021-10-29 ENCOUNTER — Telehealth: Payer: Self-pay | Admitting: Family Medicine

## 2021-10-29 NOTE — Telephone Encounter (Signed)
Called patient with pacific interpreter [phone] and informed her of results. Asked patient if she decided what type of hysterectomy she wanted and the patient stated on the abdomen not inside. Told patient I would let Dr Rip Harbour know and she may or may not need another appt prior to surgery. Patient verbalized understanding.

## 2021-10-29 NOTE — Telephone Encounter (Signed)
Calling requesting test results.

## 2021-12-06 ENCOUNTER — Encounter: Payer: Self-pay | Admitting: Obstetrics and Gynecology

## 2021-12-06 ENCOUNTER — Ambulatory Visit (INDEPENDENT_AMBULATORY_CARE_PROVIDER_SITE_OTHER): Payer: Self-pay | Admitting: Obstetrics and Gynecology

## 2021-12-06 ENCOUNTER — Other Ambulatory Visit: Payer: Self-pay

## 2021-12-06 VITALS — BP 147/81 | HR 68 | Wt 254.0 lb

## 2021-12-06 DIAGNOSIS — Z789 Other specified health status: Secondary | ICD-10-CM

## 2021-12-06 DIAGNOSIS — N939 Abnormal uterine and vaginal bleeding, unspecified: Secondary | ICD-10-CM

## 2021-12-08 DIAGNOSIS — Z789 Other specified health status: Secondary | ICD-10-CM | POA: Insufficient documentation

## 2021-12-08 NOTE — Progress Notes (Signed)
Obstetrics and Gynecology Visit Return Patient Evaluation  Appointment Date: 12/06/2021  Primary Care Provider: Jose Persia  OBGYN Clinic: [facility]  Chief Complaint: surgical consult  History of Present Illness:  Lindsey Cunningham is a 45 y.o. P3 with h/o c-section x 1, HTN, BMI 40, pre-diabetes, fibroids.  Patient with AUB and seen by Dr. Rip Harbour on 10/12. At that time, she had an embx and was referred to me for consideration for laparoscopic hyst after he talked to her about TVH and to see if she would want a TLH over a TVH, as he does not do TLHs;  embx came back benign with a polyp shown; 01/2021 pap was cytology and hpv negative  Interval History: Since that time, she states that she's had irregular AUB since early November (not heavy)  Review of Systems: as noted in the History of Present Illness.  Allergies: has No Known Allergies.  Physical Exam:  BP (!) 147/81   Pulse 68   Wt 254 lb (115.2 kg)   BMI 39.78 kg/m  Body mass index is 39.78 kg/m. General appearance: Well nourished, well developed female in no acute distress.  Abdomen: diffusely non tender to palpation, non distended, and no masses, hernias Neuro/Psych:  Normal mood and affect.    Radiology: Narrative & Impression  CLINICAL DATA:  Initial evaluation for abnormal uterine bleeding.   EXAM: TRANSABDOMINAL ULTRASOUND OF PELVIS   TECHNIQUE: Transabdominal ultrasound examination of the pelvis was performed including evaluation of the uterus, ovaries, adnexal regions, and pelvic cul-de-sac.   COMPARISON:  Prior ultrasound from 05/23/2010.   FINDINGS: Uterus   Measurements: 10.3 x 4.2 x 6.9 cm = volume: 154.2 mL. Uterus is anteverted. 2.0 x 1.5 x 2.6 cm subserosal fibroid present at the mid posterior uterine body. 1.8 x 1.1 x 1.9 cm subserosal fibroid present at the right posterior uterine body. 2.1 x 1.4 x 2.2 cm subserosal fibroid present at the left posterior  uterine body.   Endometrium   Thickness: 5.7 mm.  No focal abnormality visualized.   Right ovary   Measurements: 5.9 x 3.9 x 2.9 cm = volume: 35.0 mL. 4.1 x 3.6 x 3.1 cm simple cyst. No significant internal complexity. No vascularity or solid component.   Left ovary   Measurements: 2.9 x 1.6 x 1.9 cm = volume: 4.7 mL. Normal appearance/no adnexal mass.   Other findings:  No abnormal free fluid.   IMPRESSION: 1. Endometrial stripe measures 5.7 mm in thickness. If bleeding remains unresponsive to hormonal or medical therapy, sonohysterogram should be considered for focal lesion work-up. (Ref: Radiological Reasoning: Algorithmic Workup of Abnormal Vaginal Bleeding with Endovaginal Sonography and Sonohysterography. AJR 2008; 350:K93-81). 2. Underlying fibroid uterus as detailed above. 3. 4.1 cm simple right ovarian cyst, benign in appearance. No follow up imaging recommended. Note: This recommendation does not apply to premenarchal patients or to those with increased risk (genetic, family history, elevated tumor markers or other high-risk factors) of ovarian cancer. Reference: Radiology 2019 Nov; 293(2):359-371. 4. Normal left ovary.  No other adnexal mass or free fluid.     Electronically Signed   By: Jeannine Boga M.D.   On: 09/16/2021 20:49    Assessment: pt stable  Plan:  1. Abnormal uterine bleeding (AUB) I had a long d/w her and her husband re: hysterectomy. I d/w them re: methods for hysterectomy and pros and cons of each. I told her that in her case that a TVH would be best as the size  of her uterus doesn't preclude it and that a TVH is the optimal route for hysterectomy. I told her that a [place] could also be done but that a TVH is always preferred when surgically feasible and with the indication for bleeding and no need to examine the abdomen.   I had a very long d/w them and they wanted to do an open hysterectomy. I asked them repeatedly why and they said  because they knew a relative that had a vaginal hysterectomy and they had a lot of pain. I told them that an open hysterectomy would not be considered first line in her case because of the two other routes and the increased pain, bleeding, longer recovery time, etc involved with an open procedure, but they insisted that they wanted an open hysterectomy. They said they were approved by [facility] financial assistance until early next year. I told them that since they wanted to do an open hysterectomy I will let Dr Rip Harbour know to see if he is amenable to this, but that he may not agree to proceed with an open hyst in her situation.   2. Language barrier In person, female interpreter used   RTC: PRN  Durene Romans MD Attending Center for [employer] [facility])

## 2021-12-12 ENCOUNTER — Encounter: Payer: Self-pay | Admitting: Obstetrics and Gynecology

## 2022-01-06 ENCOUNTER — Telehealth: Payer: Self-pay | Admitting: Family Medicine

## 2022-01-06 NOTE — Telephone Encounter (Signed)
Called pt with [place] # [phone] and informed pt that we will have ask that she waits until the surgery scheduler calls with an appt.  As that is something that we do not schedule here in the office.  Pt verbalized understanding.   Mel Almond, RN  01/06/22

## 2022-01-06 NOTE — Telephone Encounter (Signed)
Calling to get her surgery date

## 2022-02-10 ENCOUNTER — Telehealth: Payer: Self-pay

## 2022-02-10 NOTE — Telephone Encounter (Signed)
Health Coaching 3    Goals- Patient has been working on decreasing carb intake. Patient states that she has decreased her salt intake, has been exercising more often and working on weight loss. She has also been going to the gym to work out.  New goal-   Barrier to reaching goal-    Strategies to overcome-    Navigation:  Patient is aware of  a follow up session. Patient is scheduled for follow-up visit on March 12, 2022 @ 10:00 am.   Time-  10 minutes

## 2022-03-12 ENCOUNTER — Inpatient Hospital Stay: Payer: Self-pay | Attending: Obstetrics and Gynecology | Admitting: *Deleted

## 2022-03-12 ENCOUNTER — Ambulatory Visit: Payer: No Typology Code available for payment source

## 2022-03-12 ENCOUNTER — Other Ambulatory Visit: Payer: Self-pay

## 2022-03-12 VITALS — BP 140/90 | Ht 66.0 in | Wt 256.3 lb

## 2022-03-12 DIAGNOSIS — Z0000 Encounter for general adult medical examination without abnormal findings: Secondary | ICD-10-CM

## 2022-03-12 DIAGNOSIS — Z1231 Encounter for screening mammogram for malignant neoplasm of breast: Secondary | ICD-10-CM

## 2022-03-12 NOTE — Progress Notes (Signed)
[facility] follow up ?  ?Interpreter: Maud Deed, [place] ? ?Clinical Measurement:   ?Vitals:  ? 03/12/22 1055  ?BP: (!) 150/100  ?  ?  ?Medical History:  Patient states that she has high cholesterol, has high blood pressure and she does not have diabetes. ? ?Medications:  Patient states that she does not take medication to lower cholesterol, blood pressure and blood sugar.  Patient does not take an aspirin a day to help prevent a heart attack or stroke.  ?  ?Blood pressure, self measurement: Patient states that she does not measure blood pressure from home. She checks her blood pressure N/A. She shares her readings with a health care provider: N/A. ?  ?Nutrition: Patient states that on average she eats 2 cups of fruit and 1 cups of vegetables per day. Patient states that she does not eat fish at least 2 times per week. Patient eats less than half servings of whole grains. Patient drinks less than 36 ounces of beverages with added sugar weekly: yes. Patient is currently watching sodium or salt intake: yes. In the past 7 days patient has had 0 drinks containing alcohol. On average patient drinks 0 drinks containing alcohol per day.     ? ?Physical activity:  Patient states that she gets 210 minutes of moderate and 210 minutes of vigorous physical activity each week. ? ?Smoking status:  Patient states that she has has never smoked . ?  ?Quality of life:  Over the past 2 weeks patient states that she had little interest or pleasure in doing things: not at all. She has been feeling down, depressed or hopeless:not at all.  ?  ?Risk reduction and counseling:  ? ?Health Coaching: Spoke with patient about continuing to try and add vegetables in daily diet. Patient has been consuming at least one serving of fish weekly. Patient has not been consuming whole grains. Gave examples of different whole grains that patient can try incorporating into regular diet. Patient consumes 1-2 cups of soda per week. Patient has been going to  the gym daily to exercise. Patient has been doing a variety of low-intensity and high-intensity works for at least 1 hour daily.  ?  ?Navigation: This was the  follow up session for this patient, I will check up on her progress in the coming months. Will call patient with follow-up appointment information for Internal Medicine once appointment is scheduled for elevated blood pressure. ? ?Time: 25 minutes  ?

## 2022-03-19 ENCOUNTER — Ambulatory Visit (INDEPENDENT_AMBULATORY_CARE_PROVIDER_SITE_OTHER): Payer: Self-pay | Admitting: Internal Medicine

## 2022-03-19 ENCOUNTER — Other Ambulatory Visit: Payer: Self-pay

## 2022-03-19 ENCOUNTER — Encounter: Payer: Self-pay | Admitting: Internal Medicine

## 2022-03-19 VITALS — BP 138/82 | HR 70 | Temp 98.1°F | Wt 253.6 lb

## 2022-03-19 DIAGNOSIS — E7800 Pure hypercholesterolemia, unspecified: Secondary | ICD-10-CM

## 2022-03-19 DIAGNOSIS — N939 Abnormal uterine and vaginal bleeding, unspecified: Secondary | ICD-10-CM

## 2022-03-19 DIAGNOSIS — R7303 Prediabetes: Secondary | ICD-10-CM

## 2022-03-19 DIAGNOSIS — I10 Essential (primary) hypertension: Secondary | ICD-10-CM

## 2022-03-19 LAB — POCT GLYCOSYLATED HEMOGLOBIN (HGB A1C): Hemoglobin A1C: 5.3 % (ref 4.0–5.6)

## 2022-03-19 LAB — GLUCOSE, CAPILLARY: Glucose-Capillary: 74 mg/dL (ref 70–99)

## 2022-03-19 NOTE — Assessment & Plan Note (Signed)
Lindsey Cunningham has a past medical history of elevated LDL with low ASCVD risk at 1.5%.  We will recheck lipid panel today. ? ?- Repeat lipid panel pending ?

## 2022-03-19 NOTE — Assessment & Plan Note (Signed)
Mrs. Devincentis states she has a long-term history of abnormal uterine bleeding with significantly heavy and prolonged menstrual cycles.  She has met with OB/GYN before who recommended a vaginal hysterectomy, however she was not willing to undergo this at the time.  She was set on only open hysterectomy.  Due to this, OB/GYN stated they would refer her to general surgery but she has not heard from their office yet.  She continues to experience significant vaginal bleeding as become quite bothersome. ? ?Assessment/plan: ?On further discussion, patient is open to vaginal hysterectomy as this is likely her only option at this time in regards to surgical intervention.  Given her lack of insurance, open hysterectomy via general surgery is not a viable  option. ?  ?- Referral to placed to OB/GYN to continue discussions regarding vaginal hysterectomy  ?- Will reach out to Dr. Rip Harbour to inform him of our discussion ?

## 2022-03-19 NOTE — Progress Notes (Signed)
? ?  CC: Elevated BP; pre-diabetes; elevated LDL ? ?HPI: ? ?[name] is a 46 y.o. with a PMHx as listed below who presents to the clinic for Elevated BP; pre-diabetes; elevated LDL.  ? ?Please see the Encounters tab for problem-based Assessment & Plan regarding status of patient's acute and chronic conditions. ? ?Past Medical History:  ?Diagnosis Date  ? Irregular periods/menstrual cycles   ? ?Review of Systems: Review of Systems  ?Constitutional:  Negative for chills, fever, malaise/fatigue and weight loss.  ?Respiratory:  Negative for cough and shortness of breath.   ?Cardiovascular:  Negative for chest pain, palpitations and leg swelling.  ?Gastrointestinal:  Negative for abdominal pain, blood in stool, constipation, diarrhea, melena, nausea and vomiting.  ?Genitourinary:  Negative for dysuria, frequency, hematuria and urgency.  ?Musculoskeletal:  Negative for back pain, myalgias and neck pain.  ?Skin:  Negative for itching and rash.  ?Neurological:  Negative for dizziness and headaches.  ?Psychiatric/Behavioral:  Negative for depression.   ? ?Physical Exam: ? ?Vitals:  ? 03/19/22 1341 03/19/22 1342  ?BP:  138/82  ?Pulse:  70  ?Temp:  98.1 ?F (36.7 ?C)  ?TempSrc:  Oral  ?SpO2:  100%  ?Weight: 253 lb 9.6 oz (115 kg)   ? ?Physical Exam ?Vitals and nursing note reviewed.  ?Constitutional:   ?   Appearance: She is obese.  ?HENT:  ?   Head: Normocephalic and atraumatic.  ?Eyes:  ?   Conjunctiva/sclera: Conjunctivae normal.  ?   Pupils: Pupils are equal, round, and reactive to light.  ?Cardiovascular:  ?   Rate and Rhythm: Normal rate and regular rhythm.  ?   Heart sounds: No murmur heard. ?  No gallop.  ?Pulmonary:  ?   Effort: Pulmonary effort is normal. No respiratory distress.  ?   Breath sounds: Normal breath sounds. No wheezing, rhonchi or rales.  ?Abdominal:  ?   General: Bowel sounds are normal. There is no distension.  ?   Palpations: Abdomen is soft.  ?   Tenderness: There is no abdominal tenderness.  There is no guarding.  ?Musculoskeletal:     ?   General: No deformity.  ?   Right lower leg: No edema.  ?   Left lower leg: No edema.  ?Skin: ?   General: Skin is warm and dry.  ?Neurological:  ?   General: No focal deficit present.  ?   Mental Status: She is alert and oriented to person, place, and time. Mental status is at baseline.  ?   Gait: Gait normal.  ?Psychiatric:     ?   Mood and Affect: Mood normal.     ?   Behavior: Behavior normal.     ?   Thought Content: Thought content normal.     ?   Judgment: Judgment normal.  ? ? ?Assessment & Plan:  ? ?See Encounters Tab for problem based charting. ? ?Patient discussed with Dr.  Saverio Danker ? ?

## 2022-03-19 NOTE — Assessment & Plan Note (Signed)
Lab Results  ?Component Value Date  ? HGBA1C 5.3 03/19/2022  ? ?A1c today improved significantly to within normal limits.  No further intervention indicated at this time. ? ?- Repeat A1c in 1 year ?

## 2022-03-19 NOTE — Patient Instructions (Addendum)
It was nice seeing you today! Thank you for choosing [facility] for your Primary Care.  ?  ?Today we talked about:  ? ?High blood pressure: We talked about the importance of a low salt diet today and I printed out some information for you as well. Let's follow up in 3 months and recheck your blood pressure. If it remains high, I do recommend starting medication.  ? ?Your sugar levels have improved!! You are no longer in the pre-diabetic range ? ?We will check your cholesterol today. I will call you with the results.  ? ?I will reach out to the OB/GYN surgeon regarding the surgery.  ?-------------------------------- ????? ?????? ?????! ????? ???????? [facility] ??????? ???????. ? ??????? ????? ??: ? ?1. ?????? ??? ????: ?????? ????? ?? ????? ????? ???? ????? ???? ????? ???? ?????? ??? ????????? ??? ?????. ???? ????? ?? 3 ???? ????? ??? ??? ????. ??? ???? ?????? ? ????? ???? ???? ??????. ? ?2. ????? ??????? ????? ???? !! ?? ??? ?? ???? ?? ??? ?????? ? ?3. ??? ????? ?? ??????????? ?????. ????? ?? ?? ???????. ? ?4. ??????? ?? ???? ?????? ???????? ????? ???????. ?

## 2022-03-19 NOTE — Assessment & Plan Note (Signed)
Lindsey Cunningham has a past medical history of elevated BP readings.  She is not currently on any antihypertensives.  She denies any chest pain, shortness of breath, lower extremity swelling, dizziness, palpitations. ? ?Assessment/plan: ?Per chart review, last several blood pressure readings are well within the hypertensive range.  I recommended initiating antihypertensive medication today, however Mrs. Josey strongly prefers to work on lifestyle modifications first.  She notes that she has been eating a significant amount of high salt products lately. ? ?- Discussed low-salt diet with PDF provided ?- 42-monthfollow-up for BP recheck ?

## 2022-03-20 LAB — LIPID PANEL
Chol/HDL Ratio: 4 ratio (ref 0.0–4.4)
Cholesterol, Total: 185 mg/dL (ref 100–199)
HDL: 46 mg/dL (ref >39)
LDL Chol Calc (NIH): 120 mg/dL — ABNORMAL HIGH (ref 0–99)
Triglycerides: 104 mg/dL (ref 0–149)
VLDL Cholesterol Cal: 19 mg/dL (ref 5–40)

## 2022-03-21 NOTE — Progress Notes (Signed)
Internal Medicine Clinic Attending  Case discussed with Dr. Basaraba  At the time of the visit.  We reviewed the resident's history and exam and pertinent patient test results.  I agree with the assessment, diagnosis, and plan of care documented in the resident's note.  

## 2022-03-24 ENCOUNTER — Encounter: Payer: Self-pay | Admitting: Internal Medicine

## 2022-03-24 NOTE — Progress Notes (Signed)
LDL above goal for primary prevention. Recommend dietary and lifestyle modification. Letter sent

## 2022-04-03 ENCOUNTER — Telehealth: Payer: Self-pay

## 2022-04-03 NOTE — Telephone Encounter (Signed)
Called pt with [place] # [phone] and informed pt that she has been scheduled with Dr. Rip Harbour to make sure that she is a candidate for the type of surgery that she is requesting.  I informed pt that her appt is scheduled for 04/21/22 @ 1035.  Pt confirmed that she has been approved for [employer] .  Pt verbalized understanding with no further questions.  ? ?Harun Brumley,RN  ?04/03/22 ?

## 2022-04-21 ENCOUNTER — Ambulatory Visit (INDEPENDENT_AMBULATORY_CARE_PROVIDER_SITE_OTHER): Payer: Self-pay | Admitting: Obstetrics and Gynecology

## 2022-04-21 ENCOUNTER — Encounter: Payer: Self-pay | Admitting: Obstetrics and Gynecology

## 2022-04-21 VITALS — BP 133/79 | HR 68 | Ht 66.93 in | Wt 247.5 lb

## 2022-04-21 DIAGNOSIS — D219 Benign neoplasm of connective and other soft tissue, unspecified: Secondary | ICD-10-CM

## 2022-04-21 DIAGNOSIS — N939 Abnormal uterine and vaginal bleeding, unspecified: Secondary | ICD-10-CM

## 2022-04-21 DIAGNOSIS — Z758 Other problems related to medical facilities and other health care: Secondary | ICD-10-CM

## 2022-04-21 DIAGNOSIS — Z789 Other specified health status: Secondary | ICD-10-CM

## 2022-04-21 NOTE — Patient Instructions (Signed)
??????? ??? ??????? ????? ??? ?????? ?Vaginal Hysterectomy, Care After ?????????? ??????? ??????? ??????? ?????? ??? ?????? ???????. ????? ????? ???? ??????? ?????? ????? ??????? ???? ???????. ???? ???? ????? ?????? ?? ?????? ??????? ??????? ????? ??????? ?????? ????? ???. ????? ?????? ??? ??????? ???????? ??? ?????? ???? ?? ??? ??? ??????? ???????: ? ?????? ???? ???? ?????? ???????. ? ???? ????? ?????? ???? ??? ??? ?????? ????. ???????? ??? ??????? ??? ???? (????? ??????) ??? ??? ???????. ? ????? ???? ??????? (?????). ? ?????? ????? ?? ?????? ???????. ? ????? (?????). ? ??? ??????. ? ?????? ?????? ?? ?????? ?????. ? ?????? ??????? ?? ???????? ????. ???? ??? ????? ??????? ?????? ??? ?????? ????? ?? ???? ????? ?????? ????? ??? ??? ??????? ????? ?????? ???? ????? (?????). ?????? ????????? ??????? ?? ???????: ???????? ? ? ?????? ??????? ??? ??????? ???? ??????? ??????? ????? ????? ????? ???? ?? ??? ???? ????. ? ?? ??????? ???????? ?? ?????? ???????? ????????????? (NSAIDs)? ??? ????????????. ?? ????? ??? ??????? ?? ???? ????. ? ????? ???? ??????? ?????? ??? ??? ??? ?????? ??????? ???: ?? ???? ????? ???? ??????? ?? ??????? ?????????. ?? ???? ?? ???? ???????. ????? ??????? ??? ????? ??? ????????? ??????? ?? ???????? ?? ????? ?? ???? ?????: ?? ????? ????? ????? ?? ??????? ???? ??? ???? ???? ??????. ?? ?????? ????? ????? ??? ???? ???? ?? ????? ????. ?? ?????? ??????? ?????? ???????? ??? ????????? ??????? ??????? ???????? ?????????? ???????. ?? ???? ????? ??????? ???? ????? ??? ??? ????? ?? ?????? ????????? ??????? ??? ??????? ??????? ?? ????????. ??????? ? ? ????? ??? ??? ??? ?? ??????? ??? ??????? ???? ??????? ?????? ??????? ???. ? ????? ?????? ??????? ??????? ??? ??????? ??????? ??????? ??????. ????? ???? ??????? ?????? ?? ??????? ?????? ???. ? ???? ?????? ???? ????? ??? ????. ????? ????? ????? ????? ?? ???? ?? ??????. ???? ??? ??? ?????? ???? ????? ???????. ????? ???????? ??? ???? ?????? ?? ??? ???????. ? ????? ?? ????  ??????? ??? ?? ?????? ???? ????? ??? ??????? ????????? ?? ??????? ???????. ? ?? ???? ????? ???? ???? ?? 10 ??? (4.5 ???)? ?? ????? ???? ??????? ??? ??? ?? ????? ??????? ??????? ?????? ??????? ?? ?? ??? ?????. ? ???? ???? ?? ?????? ?????? ?? ????? ???????? ??? ????? ?????? ????? ???? ?????. ?? ?????? ??????? ?? ??????? ?????? ??? ?? ?????? ???? ??????? ?????? ???? ???????? ??? ?????. ???? ?????? ? ?? ??????? ?????? ????? ??? ????????? ?? ?????. ????? ??? ??????? ???? ????? ?????? ??????? ???????????? ??? ??????? ???????????. ??? ???? ??? ??????? ??? ???????. ??????? ???? ??????? ?????? ??? ???? ????? ??? ???????? ??????? ?? ???????. ? ?? ??????? ?????? ??? ?? ????? ????? ??????? ?????? ??? ???. ???????? ???? ? ?? ??????? ????? ?????? ?? ???????? ???????? ?? ?????? ????? ???? 6 ?????? ??? ?????? ?? ??? ??????? ???? ??????? ?????? ??????? ??. ? ??? ??? ?????? ?? ?????? ????? ?? ?????? ??? ???????? ?????? ?? ???? ??????? ?????? ????? ?? ?? ???????. ? ????? ????? ???? ?????? ????? ?????? ??? ?????? ??? ???????. ? ?? ?????? ?? ????? ?? ??????? ???? ???? ??? ??? ?????? ????? ??????? ??????. ????? ???? ?? ?? ???? ??? ??? ?????????? ??? ???? ???? ??????? ?? 3 ??????. ? ????? ????? ????? ??? ??????? ???? ??????? ??????. ????? ??? ??????? ??? ??????? ?? ??????? ??????? ?????? ?????? ?? ??????. ? ?????? ????? ?????? ????????. ???? ??? ???. ?????? ?????? ??????? ?????? ?? ??????? ???????: ? ??? ???? ????? ????? ?????. ? ??????? ????. ? ???? ??? ?? ???? ?? ?????. ? ?????? ?????. ? ???? ?? ?? ???? ?? ??????? ????? ??????? ?? ??????? ????? ?? ????? ??? ???????. ? ??????? ?? ?????? ????? ?? ?????? ???  3-5 ???? ?? ???????. ?????? ???????? ????? ?? ??????? ???????: ? ?????? ????? ??? ?? ???? ?? ????. ? ??????? ???????. ? ???? ???? ???? ???? ?? ????? ?? ?????? ??????? ???? ?????? ????? ?????? ??? ??? ?? ????. ? ??????? ???? ?? ?????? ?? ??? ??????. ? ?????? ??????? ?? ???? ???? ?? ?????? ?? ???? ???????. ????? ???? ??? ??????? ???  ????? ????? ????? ???? ???? ?????. ?? ?????? ???? ?? ??? ???? ??????? ????? ?? ??. ??? ????? ??? ???????? ?????? ??? ?????. ????? ?????? ??????? ??????? (911 ?? ???????? ??????? ?????????). ?? ????? ??????? ?????? ??? ????????. ????? ? ??? ???????? ?? ?????? ?? ????? ?? ??? ?? ???? ????? ?? ????? ?? ????? ????? ?? ????? ???????? ?? ????? ????? ?? ???????? ????. ? ?????? ??????? ??? ??????? ???? ??????? ??????? ????? ????? ????? ???? ?? ??? ???? ????. ? ????? ??? ??? ??? ?? ??????? ??? ??????? ???? ??????? ?????? ??????? ???. ????? ?????? ??????? ??????? ??? ??????? ??????? ??????? ??????. ? ????? ???????? ??????? ?????? ??? ?? ??? ???? ????? ??????? ?? ??? ??? ?????? ?? ??? ?? ???? ?? ????? ?? ?????? ???? ????? ??? ???????. ? ????? ??? ???????? ??? ????? ??? ??? ?????? ?? ??? ???? ?? ???? ?? ????? ?? ??? ??? ?????? ?? ???????? ?? ?? ???? ????? ?? ?????? ?? ??? ?? ????? ?? ??? ?? ??????. ???? ????? ?? ??? ????????? ?? ???? ?????? ????????? ???? ?????? ???? ??????? ??????. ???? ?? ?????? ??? ????? ???? ?? ???? ?? ???? ??????? ??????.? ?Document Revised: 10/09/2020 Document Reviewed: 10/01/2020 ?[employer] Patient Education ? [place] ???????? ????? ??? ?????? ?Vaginal Hysterectomy ? ???????? ????? ??? ?????? ?????? ??? ?????? ????? ?????? ?? ??? ??? ??? ?? ???? ?? ??????. ????? ???? ????? ??????? ?????? ????? ??????? ??? ?? ??? ??? ?????? ?? ??? ???????. ??? ??? ????? ??? ?????? ?????? ?????? ?? ????? ????? ??? ?????? ??????. ??? ????? ??????? ??????? ??????? ???????? ?????? ?????. ???? ???? ???????? ???? ??????? ????? ?????? ?? ????????? ???: ? ??? ??? ?????? ?? ????? (????? ????? ???????) ???? ????? ???? ?????. ? ???? ????? ???? ????? ???? ??? ????? ????? ?? ????? ???? ????? (?????? ????? ???? ?? ????? ????? ????????). ? ?????? ???? ?? ??? ?????. ? ????? ??? ????? ?? ?????? ?? ????? ?? ??????? ??????? ????? (????? ?????). ? ????? ?????? ?? ??????. ???? ????? ?????? ?? ????? ???? ??????? ?????? ????????  ?????? ?????. ??? ??? ???????? ?? ????? ?????? ??? ????? ???????? ??? ?????? ?????? ???????. ?????? ???? ??????? ?????? ??: ? ???? ????? ???????? ???? ?????? ????. ? ???? ??????? ???? ?????????? ??? ?? ??? ??????????? ???????? ?????? ????? ????????? ???????? ???? ????? ??? ???? ????. ? ???? ???????? ?????? ???? ??? ?? ???????? ??? ?? ??? ????? ?????? ??? ?????? ??????? ???????. ? ???? ???????? ???? ???? ?????? ????. ? ???? ???????? ???????? ???? ??? ??? ?????? ???. ? ???? ??????? ?????? ???? ?????? ????. ? ??? ???? ?????? ?? ????? ?? ???? ???. ??? ???????? ????? ???? ??? ??????? ???. ??? ???? ??? ???? ??? ???????? ??? ?????: ? ???. ? ??????? ?????. ? ?????? ???? ?? ??????? ?? ?????. ? ??? ???? ??? ??????? ??????? ?? ???????. ? ??? ?? ????? ???????. ? ??????? ?????? ???? ???????. ????? ???? ??? ??? ???????? ?????? ?????? ? ?????? ??????? ????? ??????? ?????? ???? ??? ?????????? ???? ?? ????: ? ??? ??????? ????? ??? ??? ?????? - ?????? ?????? ????? ????? ????? ??? ????? ?????? ??????? ??????? ??????? ??????? ?????? ??????. ????? ????? ?????? ??????? ?????? ??????? ????? ??????? ?????? ???? ????? ?????? ??????? ???? ?? ????: ? ?????? ?? ????? ??????? ?? ????????? ??????? ??? ????? ?? ????????? ??????? ?? ????????? ????? ?????? ??? ??????? ????  8 ?????. ? ?????? ?? ????? ??????? ?? ????????? ???????? ??? ??? ?????? ?? ???? ??????? ??? ??????? ???? 6 ?????. ? ?????? ?? ????? ?????? ?? ????????? ???? ????? ???? ??? ??????? ???? 6 ?????. ? ?????? ?? ????? ??????? ??????? ??? ??????? ???????. ???????? ? ????? ???? ??????? ?????? ??: ?? ????? ?? ????? ??????? ???????? ?????. ???? ??? ??? ???? ???? ??? ??? ???????? ???? ?????? ?? ????? ????? ????. ?? ?????? ????? ??? ???????? ?????????????. ???? ??????? ???? ?? ???? ????? ????. ?? ??????? ??? ??????? ??? ????? ???? ??????? ?????? ??????? ??? ?? ?????????. ?? ??????? ????? ??????? ???????????? ???????? ????????? ???????? ???? ????? ??? ???? ????. ? ?? ????? ???? ????? ????  ?????? ??????? ?? ???? ??????? (????? ???????). ???????? ???? ? ??? ??? ???? ???? ??????? ?????? ??????? ?? ?????? ?????? ???????? ?????? ???????? ????? ???. ? ???? ?? ???? ??? ??????? ??? ??????? ???? ?????? ???

## 2022-04-21 NOTE — Progress Notes (Signed)
Pt presents with husband to discuss surgery. See prior office notes. Pt desires to proceed with vaginal hysterectomy with possible salpingectomy.  ? ?PE AF VSS ?Chaperone present during exam ? ?Lungs clear Heart RRR ?Abd soft + Bs ?GU nl EGBUS, uterus @ 8 weeks, mobile, no masses ? ?A/P  DUB ?        Uterine fibroids ? ?TVH with possible salpingectomy reviewed with pt and husband. ?R/B/Post op care reviewed with pt. Information provided. Hysterectomy papers signed today. ?Will schedule. F/U with post op appt.  ?Video interrupter used during today's visit ?

## 2022-05-08 ENCOUNTER — Other Ambulatory Visit: Payer: Self-pay

## 2022-05-08 ENCOUNTER — Encounter (HOSPITAL_BASED_OUTPATIENT_CLINIC_OR_DEPARTMENT_OTHER): Payer: Self-pay | Admitting: Obstetrics and Gynecology

## 2022-05-08 NOTE — Progress Notes (Signed)
Spoke w/ via phone for pre-op interview---Lindsey Cunningham via [facility] # [phone], Arabic ?Lab needs dos----  urine pregnancy POCT per anesthesia, surgeon orders pending as of 05/08/22             ?Lab results------05/12/22 lab appt for CBC, type & screen, BMP, EKG ?COVID test -----patient states asymptomatic no test needed ?Arrive at -------0830 on Wednesday, 05/14/22 ?NPO after MN NO Solid Food.  Clear liquids from MN until---0730 ?Med rec completed ?Medications to take morning of surgery -----NONE ?Diabetic medication -----n/a ?Patient instructed no nail polish to be worn day of surgery ?Patient instructed to bring photo id and insurance card day of surgery ?Patient aware to have Driver (ride ) / caregiver    for 24 hours after surgery - husband, Milon Dikes ?Patient Special Instructions -----Extended / overnight instructions given. ?Pre-Op special Istructions -----Requested orders from Dr. Rip Harbour on 05/07/22 via Epic IB ?Patient verbalized understanding of instructions that were given at this phone interview. ?Patient denies shortness of breath, chest pain, fever, cough at this phone interview.  ? ?Patient speaks Arabic. She declined an interpreter for day of surgery. She stated that her husband will interpret for her. Patient also stated that her husband would be able to read the printed instructions given to her at her lab appointment. ?

## 2022-05-08 NOTE — Progress Notes (Signed)
? ? Your procedure is scheduled on Wednesday, 05/14/22. ? Report to [place] ? Call this number if you have problems the morning of surgery  :[phone]. ? ? [facility].  WE ARE LOCATED IN [facility]. ? ?PLEASE BRING YOUR INSURANCE CARD AND PHOTO ID DAY OF SURGERY. ? ?ONLY 2 PEOPLE ARE ALLOWED IN  WAITING  ROOM.  ?                                   ? REMEMBER: ? DO NOT EAT FOOD, CANDY GUM OR MINTS  AFTER MIDNIGHT THE NIGHT BEFORE YOUR SURGERY . YOU MAY HAVE CLEAR LIQUIDS FROM MIDNIGHT THE NIGHT BEFORE YOUR SURGERY UNTIL  7:30 AM. NO CLEAR LIQUIDS AFTER   7:30 AM DAY OF SURGERY. ? ?YOU MAY  BRUSH YOUR TEETH MORNING OF SURGERY AND RINSE YOUR MOUTH OUT, NO CHEWING GUM CANDY OR MINTS. ? ? ? ? ?CLEAR LIQUID DIET ? ? ?Foods Allowed                                                                     Foods Excluded ? ?Coffee and tea, regular and decaf                             liquids that you cannot  ?Plain Jell-O                                                                   see through such as: ?Fruit ices (not with fruit pulp)                                     milk, soups, orange juice  ?Plain  Popsicles                                    All solid food ?Carbonated beverages, regular and diet                                    ?Cranberry, grape and apple juices ?Sports drinks like Gatorade ?_____________________________________________________________________ ?  ? ? TAKE THESE MEDICATIONS MORNING OF SURGERY: NONE ? ? ? UP TO 4 VISITORS  MAY VISIT IN THE EXTENDED RECOVERY ROOM UNTIL 800 PM ONLY.  1 VISITOR AGE 45 AND OVER MAY SPEND THE NIGHT AND MUST BE IN EXTENDED RECOVERY ROOM NO LATER THAN 800 PM . YOUR DISCHARGE TIME AFTER YOU SPEND THE NIGHT IS 900 AM THE MORNING AFTER YOUR SURGERY. ? ?YOU MAY PACK A SMALL OVERNIGHT BAG WITH TOILETRIES FOR YOUR OVERNIGHT STAY IF YOU  WISH. ? ?YOUR PRESCRIPTION MEDICATIONS WILL BE PROVIDED DURING  [facility]. ? ? ?                                   ?DO NOT WEAR JEWERLY, MAKE UP. ?DO NOT WEAR LOTIONS, POWDERS, PERFUMES OR NAIL POLISH ON YOUR FINGERNAILS. TOENAIL POLISH IS OK TO WEAR. ?DO NOT SHAVE FOR 48 HOURS PRIOR TO DAY OF SURGERY. ?MEN MAY SHAVE FACE AND NECK. ?CONTACTS, GLASSES, OR DENTURES MAY NOT BE WORN TO SURGERY. ? ?REMEMBER: NO SMOKING, DRUGS OR ALCOHOL FOR 24 HOURS BEFORE YOUR SURGERY. ?                                   ?[facility] IS NOT RESPONSIBLE  FOR ANY BELONGINGS.                                  ?                                  . ?          [facility] - Preparing for Surgery ?Before surgery, you can play an important role.  Because skin is not sterile, your skin needs to be as free of germs as possible.  You can reduce the number of germs on your skin by washing with CHG (chlorahexidine gluconate) soap before surgery.  CHG is an antiseptic cleaner which kills germs and bonds with the skin to continue killing germs even after washing. ?Please DO NOT use if you have an allergy to CHG or antibacterial soaps.  If your skin becomes reddened/irritated stop using the CHG and inform your nurse when you arrive at Short Stay. ?Do not shave (including legs and underarms) for at least 48 hours prior to the first CHG shower.  You may shave your face/neck. ?Please follow these instructions carefully: ? 1.  Shower with CHG Soap the night before surgery and the  morning of Surgery. ? 2.  If you choose to wash your hair, wash your hair first as usual with your  normal  shampoo. ? 3.  After you shampoo, rinse your hair and body thoroughly to remove the  shampoo.                            ?4.  Use CHG as you would any other liquid soap.  You can apply chg directly  to the skin and wash , please wash your belly button thoroughly with chg soap provided night before and morning of your surgery. ?                    Gently with a scrungie or clean washcloth. ? 5.  Apply the CHG Soap to your body  ONLY FROM THE NECK DOWN.   Do not use on face/ open      ?                     Wound or open sores. Avoid contact with eyes, ears mouth and genitals (private parts).  ?  Wash face,  Genitals (private parts) with your normal soap. ?            6.  Wash thoroughly, paying special attention to the area where your surgery  will be performed. ? 7.  Thoroughly rinse your body with warm water from the neck down. ? 8.  DO NOT shower/wash with your normal soap after using and rinsing off  the CHG Soap. ?               9.  Pat yourself dry with a clean towel. ?           10.  Wear clean pajamas. ?           11.  Place clean sheets on your bed the night of your first shower and do not  sleep with pets. ?Day of Surgery : ?Do not apply any lotions/deodorants the morning of surgery.  Please wear clean clothes to the hospital/surgery center. ? ?IF YOU HAVE ANY SKIN IRRITATION OR PROBLEMS WITH THE SURGICAL SOAP, PLEASE GET A BAR OF GOLD DIAL SOAP AND SHOWER THE NIGHT BEFORE YOUR SURGERY AND THE MORNING OF YOUR SURGERY. PLEASE LET THE NURSE KNOW MORNING OF YOUR SURGERY IF YOU HAD ANY PROBLEMS WITH THE SURGICAL SOAP. ? ? ?________________________________________________________________________                  ?                                    ?  QUESTIONS CALL Sarya Linenberger PRE OP NURSE PHONE [phone].                                    ?

## 2022-05-12 ENCOUNTER — Encounter (HOSPITAL_COMMUNITY)
Admission: RE | Admit: 2022-05-12 | Discharge: 2022-05-12 | Disposition: A | Discharge status: Home / Self Care | Payer: Self-pay | Source: Ambulatory Visit | Attending: Obstetrics and Gynecology | Admitting: Obstetrics and Gynecology

## 2022-05-12 DIAGNOSIS — Z01810 Encounter for preprocedural cardiovascular examination: Secondary | ICD-10-CM

## 2022-05-12 DIAGNOSIS — Z01818 Encounter for other preprocedural examination: Secondary | ICD-10-CM

## 2022-05-12 LAB — BASIC METABOLIC PANEL
Anion gap: 7 (ref 5–15)
BUN: 12 mg/dL (ref 6–20)
CO2: 23 mmol/L (ref 22–32)
Calcium: 8.9 mg/dL (ref 8.9–10.3)
Chloride: 107 mmol/L (ref 98–111)
Creatinine, Ser: 0.7 mg/dL (ref 0.44–1.00)
GFR, Estimated: >60 mL/min (ref >60)
Glucose, Bld: 99 mg/dL (ref 70–99)
Potassium: 4.1 mmol/L (ref 3.5–5.1)
Sodium: 137 mmol/L (ref 135–145)

## 2022-05-12 LAB — CBC
HCT: 34.7 % — ABNORMAL LOW (ref 36.0–46.0)
Hemoglobin: 10.7 g/dL — ABNORMAL LOW (ref 12.0–15.0)
MCH: 21.2 pg — ABNORMAL LOW (ref 26.0–34.0)
MCHC: 30.8 g/dL (ref 30.0–36.0)
MCV: 68.8 fL — ABNORMAL LOW (ref 80.0–100.0)
Platelets: 353 10*3/uL (ref 150–400)
RBC: 5.04 MIL/uL (ref 3.87–5.11)
RDW: 18.5 % — ABNORMAL HIGH (ref 11.5–15.5)
WBC: 8 10*3/uL (ref 4.0–10.5)
nRBC: 0 % (ref 0.0–0.2)

## 2022-05-13 NOTE — H&P (Signed)
Lindsey Cunningham is an 46 y.o. female with DUB and uterine fibroids. GYN U/S uterine fibroids, < 2 cm and vol @ 154 gms. ?EMBX normal. ?Pt desires definite therapy ? ?H/O TSVD x 2  ( largest 7# 8 oz) and LTCS x 1 ? ? ?Menstrual History: ?Menarche age: 61 ?Patient's last menstrual period was 05/01/2022. ?  ? ?Past Medical History:  ?Diagnosis Date  ? Chest pain 07/10/2019  ? CP w/ SOB. See 07/10/19 ED visit in [place]. Per MD note, VS reassuring, EKG shows no ischemia or arrhythmia. Cxray shows no evidence of pneumothorax or pneumonia. D- dimer negative. Troponins negative x 2. Patient discharged home.  ? History of prediabetes   ? 04/25/21 HgbA1C 5.8, 03/19/22 HgbA1C 5.3  ? HLD (hyperlipidemia)   ? HTN (hypertension)   ? see 03/19/22 OV note from Dr. Jose Persia in [place]. Patient had multiple elevated BP readings and anti-hypertensives were recommended. Patient wants to try lifestyle modifications.  ? Irregular periods/menstrual cycles   ? ? ?Past Surgical History:  ?Procedure Laterality Date  ? CESAREAN SECTION  2008  ? ? ?Family History  ?Problem Relation Age of Onset  ? Cancer Mother   ? Heart attack Father   ? ? ?Social History:  reports that she has never smoked. She has never used smokeless tobacco. She reports that she does not drink alcohol and does not use drugs. ? ?Allergies: No Known Allergies ? ?No medications prior to admission.  ? ? ?Review of Systems  ?Constitutional: Negative.   ?Respiratory: Negative.    ?Cardiovascular: Negative.   ?Gastrointestinal: Negative.   ?Genitourinary:  Positive for menstrual problem.  ? ?Weight 111.1 kg, last menstrual period 05/01/2022. ?Physical Exam ?Constitutional:   ?   Appearance: Normal appearance.  ?Cardiovascular:  ?   Rate and Rhythm: Normal rate and regular rhythm.  ?Pulmonary:  ?   Effort: Pulmonary effort is normal.  ?   Breath sounds: Normal breath sounds.  ?Abdominal:  ?   General: Bowel sounds are normal.  ?   Palpations: Abdomen is soft.  ?Genitourinary: ?    Comments: Nl EGBUS, uterus small < 8 weeks, mobile, non tender ? ? ?No results found for this or any previous visit (from the past 24 hour(s)). ? ?No results found. ? ?Assessment/Plan: ?DUB ?Uterine fibroids ? ? ?Pt desire definite therapy. TVH with possible salpingectomy reviewed with pt. R/B/Post op care reviewed. Pt verbalized understanding and desires to proceed.  ? ?[name] ?05/13/2022, 2:04 PM ? ?

## 2022-05-14 ENCOUNTER — Other Ambulatory Visit: Payer: No Typology Code available for payment source

## 2022-05-14 ENCOUNTER — Ambulatory Visit: Payer: No Typology Code available for payment source

## 2022-05-14 ENCOUNTER — Observation Stay (HOSPITAL_BASED_OUTPATIENT_CLINIC_OR_DEPARTMENT_OTHER)
Admission: RE | Admit: 2022-05-14 | Discharge: 2022-05-15 | Disposition: A | Discharge status: Home / Self Care | Payer: No Typology Code available for payment source | Source: Ambulatory Visit | Attending: Obstetrics and Gynecology | Admitting: Obstetrics and Gynecology

## 2022-05-14 ENCOUNTER — Other Ambulatory Visit: Payer: Self-pay

## 2022-05-14 ENCOUNTER — Encounter (HOSPITAL_BASED_OUTPATIENT_CLINIC_OR_DEPARTMENT_OTHER)
Admission: RE | Disposition: A | Discharge status: Home / Self Care | Payer: Self-pay | Source: Ambulatory Visit | Attending: Obstetrics and Gynecology

## 2022-05-14 ENCOUNTER — Ambulatory Visit (HOSPITAL_BASED_OUTPATIENT_CLINIC_OR_DEPARTMENT_OTHER): Payer: No Typology Code available for payment source | Admitting: Certified Registered Nurse Anesthetist

## 2022-05-14 ENCOUNTER — Encounter (HOSPITAL_BASED_OUTPATIENT_CLINIC_OR_DEPARTMENT_OTHER): Payer: Self-pay | Admitting: Obstetrics and Gynecology

## 2022-05-14 DIAGNOSIS — I10 Essential (primary) hypertension: Secondary | ICD-10-CM | POA: Insufficient documentation

## 2022-05-14 DIAGNOSIS — D259 Leiomyoma of uterus, unspecified: Secondary | ICD-10-CM

## 2022-05-14 DIAGNOSIS — N920 Excessive and frequent menstruation with regular cycle: Secondary | ICD-10-CM

## 2022-05-14 DIAGNOSIS — Z01818 Encounter for other preprocedural examination: Secondary | ICD-10-CM

## 2022-05-14 DIAGNOSIS — N888 Other specified noninflammatory disorders of cervix uteri: Secondary | ICD-10-CM

## 2022-05-14 DIAGNOSIS — Z9889 Other specified postprocedural states: Secondary | ICD-10-CM

## 2022-05-14 DIAGNOSIS — D219 Benign neoplasm of connective and other soft tissue, unspecified: Secondary | ICD-10-CM

## 2022-05-14 DIAGNOSIS — N8003 Adenomyosis of the uterus: Secondary | ICD-10-CM

## 2022-05-14 DIAGNOSIS — N939 Abnormal uterine and vaginal bleeding, unspecified: Secondary | ICD-10-CM

## 2022-05-14 HISTORY — DX: Hyperlipidemia, unspecified: E78.5

## 2022-05-14 HISTORY — DX: Personal history of other specified conditions: Z87.898

## 2022-05-14 HISTORY — DX: Essential (primary) hypertension: I10

## 2022-05-14 HISTORY — PX: VAGINAL HYSTERECTOMY: SHX2639

## 2022-05-14 LAB — TYPE AND SCREEN
ABO/RH(D): O POS
Antibody Screen: NEGATIVE

## 2022-05-14 LAB — POCT PREGNANCY, URINE: Preg Test, Ur: NEGATIVE

## 2022-05-14 LAB — ABO/RH: ABO/RH(D): O POS

## 2022-05-14 SURGERY — HYSTERECTOMY, VAGINAL
Anesthesia: General | Laterality: Bilateral

## 2022-05-14 MED ORDER — GLYCOPYRROLATE PF 0.2 MG/ML IJ SOSY
PREFILLED_SYRINGE | INTRAMUSCULAR | Status: DC | PRN
  Administered 2022-05-14: .2 mg via INTRAVENOUS

## 2022-05-14 MED ORDER — FENTANYL CITRATE (PF) 100 MCG/2ML IJ SOLN
INTRAMUSCULAR | Status: AC
  Filled 2022-05-14: qty 2

## 2022-05-14 MED ORDER — OXYCODONE-ACETAMINOPHEN 5-325 MG PO TABS
ORAL_TABLET | ORAL | Status: AC
  Filled 2022-05-14: qty 1

## 2022-05-14 MED ORDER — DEXAMETHASONE SODIUM PHOSPHATE 10 MG/ML IJ SOLN
INTRAMUSCULAR | Status: DC | PRN
Start: 2022-05-14 — End: 2022-05-14
  Administered 2022-05-14: 10 mg via INTRAVENOUS

## 2022-05-14 MED ORDER — LACTATED RINGERS IV SOLN: INTRAVENOUS | Status: DC

## 2022-05-14 MED ORDER — PROPOFOL 10 MG/ML IV BOLUS
INTRAVENOUS | Status: AC
  Filled 2022-05-14: qty 20

## 2022-05-14 MED ORDER — MIDAZOLAM HCL 2 MG/2ML IJ SOLN
INTRAMUSCULAR | Status: AC
  Filled 2022-05-14: qty 2

## 2022-05-14 MED ORDER — HYDROMORPHONE HCL 1 MG/ML IJ SOLN: 0.2500 mg | INTRAMUSCULAR | Status: DC | PRN

## 2022-05-14 MED ORDER — ONDANSETRON HCL 4 MG PO TABS: 4.0000 mg | ORAL_TABLET | Freq: Four times a day (QID) | ORAL | Status: DC | PRN

## 2022-05-14 MED ORDER — POVIDONE-IODINE 10 % EX SWAB: 2.0000 "application " | Freq: Once | CUTANEOUS | Status: DC

## 2022-05-14 MED ORDER — MIDAZOLAM HCL 5 MG/5ML IJ SOLN
INTRAMUSCULAR | Status: DC | PRN
Start: 2022-05-14 — End: 2022-05-14
  Administered 2022-05-14: 2 mg via INTRAVENOUS

## 2022-05-14 MED ORDER — SENNA 8.6 MG PO TABS
1.0000 | ORAL_TABLET | Freq: Two times a day (BID) | ORAL | Status: DC
  Administered 2022-05-14 (×2): 8.6 mg via ORAL

## 2022-05-14 MED ORDER — DEXAMETHASONE SODIUM PHOSPHATE 10 MG/ML IJ SOLN
INTRAMUSCULAR | Status: AC
  Filled 2022-05-14: qty 1

## 2022-05-14 MED ORDER — KETOROLAC TROMETHAMINE 15 MG/ML IJ SOLN: 15.0000 mg | INTRAMUSCULAR | Status: AC

## 2022-05-14 MED ORDER — PROPOFOL 10 MG/ML IV BOLUS
INTRAVENOUS | Status: DC | PRN
  Administered 2022-05-14: 200 mg via INTRAVENOUS

## 2022-05-14 MED ORDER — ONDANSETRON HCL 4 MG/2ML IJ SOLN: 4.0000 mg | Freq: Four times a day (QID) | INTRAMUSCULAR | Status: DC | PRN

## 2022-05-14 MED ORDER — CEFAZOLIN SODIUM-DEXTROSE 2-4 GM/100ML-% IV SOLN
2.0000 g | INTRAVENOUS | Status: AC
  Administered 2022-05-14: 2 g via INTRAVENOUS

## 2022-05-14 MED ORDER — ONDANSETRON HCL 4 MG/2ML IJ SOLN
INTRAMUSCULAR | Status: AC
Start: 2022-05-14 — End: ?
  Filled 2022-05-14: qty 2

## 2022-05-14 MED ORDER — LACTATED RINGERS IV SOLN
INTRAVENOUS | Status: DC
  Administered 2022-05-14: 1 mL via INTRAVENOUS

## 2022-05-14 MED ORDER — SIMETHICONE 80 MG PO CHEW: 80.0000 mg | CHEWABLE_TABLET | Freq: Four times a day (QID) | ORAL | Status: DC | PRN

## 2022-05-14 MED ORDER — 0.9 % SODIUM CHLORIDE (POUR BTL) OPTIME
TOPICAL | Status: DC | PRN
  Administered 2022-05-14: 1000 mL

## 2022-05-14 MED ORDER — GLYCOPYRROLATE PF 0.2 MG/ML IJ SOSY
PREFILLED_SYRINGE | INTRAMUSCULAR | Status: AC
  Filled 2022-05-14: qty 1

## 2022-05-14 MED ORDER — KETOROLAC TROMETHAMINE 30 MG/ML IJ SOLN
30.0000 mg | Freq: Four times a day (QID) | INTRAMUSCULAR | Status: DC
Start: 2022-05-14 — End: 2022-05-15
  Administered 2022-05-14 – 2022-05-15 (×3): 30 mg via INTRAVENOUS

## 2022-05-14 MED ORDER — OXYCODONE-ACETAMINOPHEN 5-325 MG PO TABS
2.0000 | ORAL_TABLET | ORAL | Status: DC | PRN
  Administered 2022-05-14 – 2022-05-15 (×3): 2 via ORAL

## 2022-05-14 MED ORDER — SOD CITRATE-CITRIC ACID 500-334 MG/5ML PO SOLN: 30.0000 mL | ORAL | Status: AC

## 2022-05-14 MED ORDER — HYDROMORPHONE HCL 2 MG/ML IJ SOLN
INTRAMUSCULAR | Status: AC
  Filled 2022-05-14: qty 1

## 2022-05-14 MED ORDER — KETOROLAC TROMETHAMINE 30 MG/ML IJ SOLN
INTRAMUSCULAR | Status: AC
  Filled 2022-05-14: qty 1

## 2022-05-14 MED ORDER — KETOROLAC TROMETHAMINE 30 MG/ML IJ SOLN
INTRAMUSCULAR | Status: DC | PRN
Start: 2022-05-14 — End: 2022-05-14
  Administered 2022-05-14: 30 mg via INTRAVENOUS

## 2022-05-14 MED ORDER — SENNA 8.6 MG PO TABS
ORAL_TABLET | ORAL | Status: AC
Start: 2022-05-14 — End: ?
  Filled 2022-05-14: qty 1

## 2022-05-14 MED ORDER — SCOPOLAMINE 1 MG/3DAYS TD PT72: 1.0000 | MEDICATED_PATCH | Freq: Once | TRANSDERMAL | Status: DC

## 2022-05-14 MED ORDER — ACETAMINOPHEN 500 MG PO TABS
ORAL_TABLET | ORAL | Status: AC
  Filled 2022-05-14: qty 2

## 2022-05-14 MED ORDER — OXYCODONE-ACETAMINOPHEN 5-325 MG PO TABS
1.0000 | ORAL_TABLET | ORAL | Status: DC | PRN
  Administered 2022-05-14 (×2): 1 via ORAL

## 2022-05-14 MED ORDER — ONDANSETRON HCL 4 MG/2ML IJ SOLN
INTRAMUSCULAR | Status: DC | PRN
  Administered 2022-05-14: 4 mg via INTRAVENOUS

## 2022-05-14 MED ORDER — SENNA 8.6 MG PO TABS
ORAL_TABLET | ORAL | Status: AC
  Filled 2022-05-14: qty 1

## 2022-05-14 MED ORDER — HYDROMORPHONE HCL 1 MG/ML IJ SOLN
INTRAMUSCULAR | Status: DC | PRN
  Administered 2022-05-14: 1 mg via INTRAVENOUS

## 2022-05-14 MED ORDER — ACETAMINOPHEN 500 MG PO TABS
1000.0000 mg | ORAL_TABLET | ORAL | Status: AC
  Administered 2022-05-14: 1000 mg via ORAL

## 2022-05-14 MED ORDER — IBUPROFEN 800 MG PO TABS: 800.0000 mg | ORAL_TABLET | Freq: Three times a day (TID) | ORAL | Status: DC

## 2022-05-14 MED ORDER — ZOLPIDEM TARTRATE 5 MG PO TABS: 5.0000 mg | ORAL_TABLET | Freq: Every evening | ORAL | Status: DC | PRN

## 2022-05-14 MED ORDER — DROPERIDOL 2.5 MG/ML IJ SOLN
0.6250 mg | Freq: Once | INTRAMUSCULAR | Status: DC | PRN
Start: 2022-05-14 — End: 2022-05-15

## 2022-05-14 MED ORDER — OXYCODONE-ACETAMINOPHEN 5-325 MG PO TABS
ORAL_TABLET | ORAL | Status: AC
  Filled 2022-05-14: qty 2

## 2022-05-14 MED ORDER — CEFAZOLIN SODIUM-DEXTROSE 2-4 GM/100ML-% IV SOLN
INTRAVENOUS | Status: AC
  Filled 2022-05-14: qty 100

## 2022-05-14 MED ORDER — LIDOCAINE 2% (20 MG/ML) 5 ML SYRINGE
INTRAMUSCULAR | Status: DC | PRN
  Administered 2022-05-14: 100 mg via INTRAVENOUS

## 2022-05-14 MED ORDER — HYDROMORPHONE HCL 1 MG/ML IJ SOLN: 1.0000 mg | INTRAMUSCULAR | Status: DC | PRN

## 2022-05-14 MED ORDER — FENTANYL CITRATE (PF) 100 MCG/2ML IJ SOLN
INTRAMUSCULAR | Status: DC | PRN
Start: 2022-05-14 — End: 2022-05-14
  Administered 2022-05-14 (×4): 50 ug via INTRAVENOUS

## 2022-05-14 SURGICAL SUPPLY — 28 items
BLADE SURG 10 STRL SS (BLADE) ×2 IMPLANT
CANISTER SUCT 1200ML W/VALVE (MISCELLANEOUS) ×2 IMPLANT
CNTNR URN SCR LID CUP LEK RST (MISCELLANEOUS) IMPLANT
CONT SPEC 4OZ STRL OR WHT (MISCELLANEOUS)
DRSG PAD ABDOMINAL 8X10 ST (GAUZE/BANDAGES/DRESSINGS) ×1 IMPLANT
GAUZE 4X4 16PLY ~~LOC~~+RFID DBL (SPONGE) ×4 IMPLANT
GLOVE BIO SURGEON STRL SZ7.5 (GLOVE) ×2 IMPLANT
GLOVE BIO SURGEON STRL SZ8 (GLOVE) ×2 IMPLANT
GLOVE BIOGEL PI IND STRL 6.5 (GLOVE) ×1 IMPLANT
GLOVE BIOGEL PI IND STRL 7.0 (GLOVE) ×2 IMPLANT
GLOVE BIOGEL PI INDICATOR 6.5 (GLOVE) ×1
GLOVE BIOGEL PI INDICATOR 7.0 (GLOVE) ×2
GOWN STRL REUS W/TWL LRG LVL3 (GOWN DISPOSABLE) ×6 IMPLANT
GOWN STRL REUS W/TWL XL LVL3 (GOWN DISPOSABLE) ×2 IMPLANT
HIBICLENS CHG 4% 4OZ BTL (MISCELLANEOUS) ×2 IMPLANT
KIT TURNOVER CYSTO (KITS) ×2 IMPLANT
NS IRRIG 1000ML POUR BTL (IV SOLUTION) ×2 IMPLANT
PACK VAGINAL WOMENS (CUSTOM PROCEDURE TRAY) ×2 IMPLANT
PAD OB MATERNITY 4.3X12.25 (PERSONAL CARE ITEMS) ×2 IMPLANT
PENCIL BUTTON HOLSTER BLD 10FT (ELECTRODE) ×2 IMPLANT
SUT VIC AB 2-0 CT1 18 (SUTURE) ×2 IMPLANT
SUT VIC AB 2-0 CT1 27 (SUTURE) ×1
SUT VIC AB 2-0 CT1 TAPERPNT 27 (SUTURE) ×1 IMPLANT
SUT VIC AB PLUS 45CM 1-MO-4 (SUTURE) ×4 IMPLANT
SUT VICRYL 1 TIES 12X18 (SUTURE) ×2 IMPLANT
SYR BULB IRRIG 60ML STRL (SYRINGE) ×2 IMPLANT
TOWEL OR 17X26 10 PK STRL BLUE (TOWEL DISPOSABLE) ×4 IMPLANT
TRAY FOLEY W/BAG SLVR 14FR LF (SET/KITS/TRAYS/PACK) ×2 IMPLANT

## 2022-05-14 NOTE — Op Note (Signed)
Lindsey Cunningham ?PROCEDURE DATE: 05/14/2022 ? ?PREOPERATIVE DIAGNOSIS:  Symptomatic fibroids, menorrhagia ?POSTOPERATIVE DIAGNOSIS:  Symptomatic fibroids, menorrhagia ?SURGEON:   Arlina Robes, M.D. ?ASSISTANT: Aletha Halim, M.D. ? ?An experienced assistant was required given the standard of surgical care given the complexity of the case.  This assistant was needed for exposure, dissection, suctioning, retraction, and for overall help during the procedure. ? ? ?OPERATION:  Total Vaginal hysterectomy and left salpingectomy ?ANESTHESIA:  General endotracheal. ? ?INDICATIONS: The patient is a 46 y.o. [id number] with history of symptomatic uterine fibroids/menorrhagia. The patient made a decision to undergo definite surgical treatment. On the preoperative visit, the risks, benefits, indications, and alternatives of the procedure were reviewed with the patient.  On the day of surgery, the risks of surgery were again discussed with the patient including but not limited to: bleeding which may require transfusion or reoperation; infection which may require antibiotics; injury to bowel, bladder, ureters or other surrounding organs; need for additional procedures; thromboembolic phenomenon, incisional problems and other postoperative/anesthesia complications. Written informed consent was obtained.   ? ?OPERATIVE FINDINGS: A 10 week size uterus with uterine fibroids, and with normal left tube and ovary. I was unable to see the right tube or ovary ? ?ESTIMATED BLOOD LOSS: 50 ml ?FLUIDS:  As recorded ?URINE OUTPUT: As recorded ?SPECIMENS:  Uterus and cervix  and left tube sent to pathology ?COMPLICATIONS:  None immediate. ? ?DESCRIPTION OF PROCEDURE:  The patient received intravenous antibiotics and had sequential compression devices applied to her lower extremities while in the preoperative area.  She was then taken to the operating room where general anesthesia was administered and was found to be adequate.  She was placed in the  dorsal lithotomy position, and was prepped and draped in a sterile manner.  A Foley catheter was inserted into her bladder and attached to constant drainage. After an adequate timeout was performed, attention was turned to her pelvis.  A weighted speculum was then placed in the vagina, and the posterior lip of the cervix were grasped bilaterally with tenaculums. The posterior cul de sac was entered with sharply. Long bill speculum was placed.  The anterior lip of the cervix was grasped.   The cervix was then circumferentially incised, and the bladder was dissected off the pubocervical fascia anteriorly without complication.  The anterior cul-de-sac was then entered sharply without difficulty and a retractor was placed.   The Zeplin clamps were then used to clamp the uterosacral ligaments on either side.  They were then cut and sutured ligated with 0 Vicryl.  Of note, all sutures used in this case were 0 Vicryl unless otherwise noted.   The cardinal ligaments were then clamped, cut and ligated. The uterine vessels and broad ligaments were then serially clamped with the Zeplin clamps, cut, and suture ligated on both sides.  Excellent hemostasis was noted at this point. For additional exposure, the uterus was morcellated using a bivalve tenchique. The final pedicle involving the uteroovarian was then clamped, cut and ligated with a free tie and then in a Glasgow fashion. This was repeated on the opposite side.  The uterus was removed and sent to pathology.   After completion of the hysterectomy, all pedicles from the uterosacral ligament to the cornua were examined hemostasis was confirmed. As noted above I was unable to see the right ovary or tube. The left tube and ovarian were seen. The left tube was grasped and clamped, cit and ligated with a free tie. The perineum was then closed  in a purse string fashion.   The vaginal cuff was then closed with 2/0 Vicryl.   All instruments were then removed from the pelvis.   The  patient tolerated the procedure well.  All instruments, needles, and sponge counts were correct x 2. The patient was taken to the recovery room in stable condition.   ? ?Arlina Robes, MD, FACOG ?Attending Obstetrician & Gynecologist ?[facility], [place]  ?

## 2022-05-14 NOTE — Anesthesia Preprocedure Evaluation (Addendum)
Anesthesia Evaluation  ?Patient identified by MRN, date of birth, ID band ?Patient awake ? ? ? ?Reviewed: ?Allergy & Precautions, NPO status , Patient's Chart, lab work & pertinent test results ? ?History of Anesthesia Complications ?Negative for: history of anesthetic complications ? ?Airway ?Mallampati: II ? ?TM Distance: >3 FB ?Neck ROM: Full ? ? ? Dental ? ?(+) Missing,  ?  ?Pulmonary ?neg pulmonary ROS,  ?  ?Pulmonary exam normal ? ? ? ? ? ? ? Cardiovascular ?hypertension, Normal cardiovascular exam ? ? ?  ?Neuro/Psych ?negative neurological ROS ? negative psych ROS  ? GI/Hepatic ?negative GI ROS, Neg liver ROS,   ?Endo/Other  ?BMI 38 ? Renal/GU ?negative Renal ROS  ?negative genitourinary ?  ?Musculoskeletal ?negative musculoskeletal ROS ?(+)  ? Abdominal ?  ?Peds ? Hematology ? ?(+) Blood dyscrasia (Hgb 10.7), anemia ,   ?Anesthesia Other Findings ?Day of surgery medications reviewed with patient. ? Reproductive/Obstetrics ?FIBROIDS,?AUB ? ?  ? ? ? ? ? ? ? ? ? ? ? ? ? ?  ?  ? ? ? ? ? ? ? ?Anesthesia Physical ?Anesthesia Plan ? ?ASA: 2 ? ?Anesthesia Plan: General  ? ?Post-op Pain Management: Tylenol PO (pre-op)* and Toradol IV (intra-op)*  ? ?Induction: Intravenous ? ?PONV Risk Score and Plan: 4 or greater and Treatment may vary due to age or medical condition, Midazolam, Scopolamine patch - Pre-op, Ondansetron and Dexamethasone ? ?Airway Management Planned: LMA ? ?Additional Equipment: None ? ?Intra-op Plan:  ? ?Post-operative Plan: Extubation in OR ? ?Informed Consent: I have reviewed the patients History and Physical, chart, labs and discussed the procedure including the risks, benefits and alternatives for the proposed anesthesia with the patient or authorized representative who has indicated his/her understanding and acceptance.  ? ? ? ?Dental advisory given and Interpreter used for interveiw ? ?Plan Discussed with: CRNA ? ?Anesthesia Plan Comments: (Patient's husband  present in preop, patient prefers him to interpret as she speaks Arabic as primary language. All questions answered to patient's satisfaction. Daiva Huge, MD)  ? ? ? ? ? ?Anesthesia Quick Evaluation ? ?

## 2022-05-14 NOTE — OR Nursing (Signed)
Family called and updated on surgery start time. ? ?Lourena Simmonds, RN ?

## 2022-05-14 NOTE — Interval H&P Note (Signed)
History and Physical Interval Note: ? ?05/14/2022 ?8:29 AM ? ?Lindsey Cunningham  has presented today for surgery, with the diagnosis of FIBROIDS ?AUB.  The various methods of treatment have been discussed with the patient and family. After consideration of risks, benefits and other options for treatment, the patient has consented to  Procedure(s): ?HYSTERECTOMY VAGINAL WITH POSSIBLE SALPINGECTOMY (Bilateral) as a surgical intervention.  The patient's history has been reviewed, patient examined, no change in status, stable for surgery.  I have reviewed the patient's chart and labs.  Questions were answered to the patient's satisfaction.   ? ? ?[name] ? ? ?

## 2022-05-14 NOTE — Anesthesia Procedure Notes (Signed)
Procedure Name: LMA Insertion ?Date/Time: 05/14/2022 9:53 AM ?Performed by: Rogers Blocker, CRNA ?Pre-anesthesia Checklist: Patient identified, Emergency Drugs available, Suction available and Patient being monitored ?Patient Re-evaluated:Patient Re-evaluated prior to induction ?Oxygen Delivery Method: Circle System Utilized ?Preoxygenation: Pre-oxygenation with 100% oxygen ?Induction Type: IV induction ?Ventilation: Mask ventilation without difficulty ?LMA: LMA inserted ?LMA Size: 4.0 ?Number of attempts: 1 ?Placement Confirmation: positive ETCO2 ?Tube secured with: Tape ?Dental Injury: Teeth and Oropharynx as per pre-operative assessment  ? ? ? ? ?

## 2022-05-14 NOTE — Transfer of Care (Signed)
Immediate Anesthesia Transfer of Care Note ? ?Patient: Lindsey Cunningham ? ?Procedure(s) Performed: HYSTERECTOMY VAGINAL WITH POSSIBLE SALPINGECTOMY (Bilateral) ? ?Patient Location: PACU ? ?Anesthesia Type:General ? ?Level of Consciousness: awake, alert , oriented and patient cooperative ? ?Airway & Oxygen Therapy: Patient Spontanous Breathing ? ?Post-op Assessment: Report given to RN and Post -op Vital signs reviewed and stable ? ?Post vital signs: Reviewed and stable ? ?Last Vitals:  ?Vitals Value Taken Time  ?BP    ?Temp    ?Pulse    ?Resp    ?SpO2    ? ? ?Last Pain:  ?Vitals:  ? 05/14/22 0850  ?TempSrc: Oral  ?PainSc: 0-No pain  ?   ? ?Patients Stated Pain Goal: 5 (05/14/22 0850) ? ?Complications: No notable events documented. ?

## 2022-05-14 NOTE — Anesthesia Postprocedure Evaluation (Signed)
Anesthesia Post Note ? ?Patient: Lindsey Cunningham ? ?Procedure(s) Performed: HYSTERECTOMY VAGINAL WITH POSSIBLE SALPINGECTOMY (Bilateral) ? ?  ? ?Patient location during evaluation: PACU ?Anesthesia Type: General ?Level of consciousness: awake and alert ?Pain management: pain level controlled ?Vital Signs Assessment: post-procedure vital signs reviewed and stable ?Respiratory status: spontaneous breathing, nonlabored ventilation and respiratory function stable ?Cardiovascular status: blood pressure returned to baseline ?Postop Assessment: no apparent nausea or vomiting ?Anesthetic complications: no ? ? ?No notable events documented. ? ?Last Vitals:  ?Vitals:  ? 05/14/22 1145 05/14/22 1200  ?BP: 131/78 125/83  ?Pulse: 66   ?Resp: 14   ?Temp:    ?SpO2: 95%   ?  ?Last Pain:  ?Vitals:  ? 05/14/22 1200  ?TempSrc:   ?PainSc: 2   ? ? ?  ?  ?  ?  ?  ?  ? ?Marthenia Rolling ? ? ? ? ?

## 2022-05-15 LAB — CBC
HCT: 31.4 % — ABNORMAL LOW (ref 36.0–46.0)
Hemoglobin: 9.8 g/dL — ABNORMAL LOW (ref 12.0–15.0)
MCH: 21.4 pg — ABNORMAL LOW (ref 26.0–34.0)
MCHC: 31.2 g/dL (ref 30.0–36.0)
MCV: 68.4 fL — ABNORMAL LOW (ref 80.0–100.0)
Platelets: 360 10*3/uL (ref 150–400)
RBC: 4.59 MIL/uL (ref 3.87–5.11)
RDW: 18.2 % — ABNORMAL HIGH (ref 11.5–15.5)
WBC: 13.7 10*3/uL — ABNORMAL HIGH (ref 4.0–10.5)
nRBC: 0 % (ref 0.0–0.2)

## 2022-05-15 LAB — BASIC METABOLIC PANEL
Anion gap: 5 (ref 5–15)
BUN: 14 mg/dL (ref 6–20)
CO2: 22 mmol/L (ref 22–32)
Calcium: 8.4 mg/dL — ABNORMAL LOW (ref 8.9–10.3)
Chloride: 110 mmol/L (ref 98–111)
Creatinine, Ser: 0.95 mg/dL (ref 0.44–1.00)
GFR, Estimated: >60 mL/min (ref >60)
Glucose, Bld: 142 mg/dL — ABNORMAL HIGH (ref 70–99)
Potassium: 3.8 mmol/L (ref 3.5–5.1)
Sodium: 137 mmol/L (ref 135–145)

## 2022-05-15 LAB — SURGICAL PATHOLOGY

## 2022-05-15 MED ORDER — IBUPROFEN 800 MG PO TABS: 800.0000 mg | ORAL_TABLET | Freq: Three times a day (TID) | ORAL | 0 refills | Status: DC

## 2022-05-15 MED ORDER — OXYCODONE-ACETAMINOPHEN 5-325 MG PO TABS: 1.0000 | ORAL_TABLET | ORAL | 0 refills | Status: DC | PRN

## 2022-05-15 MED ORDER — KETOROLAC TROMETHAMINE 30 MG/ML IJ SOLN
INTRAMUSCULAR | Status: AC
  Filled 2022-05-15: qty 1

## 2022-05-15 MED ORDER — OXYCODONE-ACETAMINOPHEN 5-325 MG PO TABS
ORAL_TABLET | ORAL | Status: AC
  Filled 2022-05-15: qty 2

## 2022-05-15 NOTE — Discharge Summary (Signed)
Physician Discharge Summary  Patient ID: Lindsey Cunningham MRN: [id number] DOB/AGE: 45-Oct-1977 45 y.o.  Admit date: 05/14/2022 Discharge date: 05/15/2022  Admission Diagnoses: Uterine fibroids  Discharge Diagnoses:  Principal Problem:   Post-operative state   Discharged Condition: good  Hospital Course: Lindsey Cunningham was admitted with above Dx. She underwent TVH with LS on 05/14/22 without problems. See OP note for additional information. Post op course was unremarkable. She progressed to ambulating, voiding, tolerating diet, and good oral pain control. Felt amendable for discharge home. Discharge instructions, medications and follow up reviewed with pt. Husband acted as interrupter.   Consults: None  Significant Diagnostic Studies: labs  Treatments: surgery: TVH and LS  Discharge Exam: Blood pressure (!) 108/56, pulse 65, temperature 98.1 F (36.7 C), resp. rate 16, height '5\' 7"'$  (1.702 m), weight 111 kg, last menstrual period 05/01/2022, SpO2 98 %. Lungs clear Heart RRR Abd soft + BS GU no bleeding Ext non tender  Disposition: Discharge disposition: 01-Home or Self Care       Discharge Instructions     Call MD for:  difficulty breathing, headache or visual disturbances   Complete by: As directed    Call MD for:  extreme fatigue   Complete by: As directed    Call MD for:  hives   Complete by: As directed    Call MD for:  persistant dizziness or light-headedness   Complete by: As directed    Call MD for:  persistant nausea and vomiting   Complete by: As directed    Call MD for:  redness, tenderness, or signs of infection (pain, swelling, redness, odor or green/yellow discharge around incision site)   Complete by: As directed    Call MD for:  severe uncontrolled pain   Complete by: As directed    Call MD for:  temperature >100.4   Complete by: As directed    Diet - low sodium heart healthy   Complete by: As directed    Increase activity slowly   Complete by: As  directed    Sexual Activity Restrictions   Complete by: As directed    Pelvic rest x 4 weeks      Allergies as of 05/15/2022   No Known Allergies      Medication List     TAKE these medications    ibuprofen 800 MG tablet Commonly known as: ADVIL Take 1 tablet (800 mg total) by mouth 3 (three) times daily.   oxyCODONE-acetaminophen 5-325 MG tablet Commonly known as: PERCOCET/ROXICET Take 1 tablet by mouth every 4 (four) hours as needed for moderate pain.        Follow-up [place] [facility]. Schedule an appointment as soon as possible for a visit in 4 week(s).   Specialty: Obstetrics and Gynecology Why: Face to face post op appt with Dr. Gardiner Fanti Contact information: [place] [address] [phone]                Signed: [name] 05/15/2022, 8:15 AM

## 2022-05-16 ENCOUNTER — Encounter (HOSPITAL_BASED_OUTPATIENT_CLINIC_OR_DEPARTMENT_OTHER): Payer: Self-pay | Admitting: Obstetrics and Gynecology

## 2022-05-22 ENCOUNTER — Ambulatory Visit
Admission: RE | Admit: 2022-05-22 | Discharge: 2022-05-22 | Disposition: A | Discharge status: Home / Self Care | Payer: No Typology Code available for payment source | Source: Ambulatory Visit | Attending: Obstetrics and Gynecology | Admitting: Obstetrics and Gynecology

## 2022-05-22 ENCOUNTER — Ambulatory Visit: Payer: Self-pay | Admitting: *Deleted

## 2022-05-22 VITALS — BP 140/80 | Wt 248.2 lb

## 2022-05-22 DIAGNOSIS — Z1239 Encounter for other screening for malignant neoplasm of breast: Secondary | ICD-10-CM

## 2022-05-22 DIAGNOSIS — Z1231 Encounter for screening mammogram for malignant neoplasm of breast: Secondary | ICD-10-CM

## 2022-05-22 NOTE — Patient Instructions (Signed)
Explained breast self awareness with Lylia Eppes. Patient did not need a Pap smear today due to last Pap smear and HPV typing was 02/25/2021 and patient has a history of a hysterectomy for benign reasons. Let patient know that she doesn't need any further Pap smears due to her history of a hysterectomy for benign reasons. Referred patient to the [facility] for a screening mammogram on the mobile unit. Appointment scheduled Thursday, May 22, 2022 at 1100. Patient aware of appointment and will be there. Let patient know the [facility] will follow up with her within the next couple weeks with results of her mammogram by letter or phone. Dynesha Wainer verbalized understanding.  Stetson Pelaez, Arvil Chaco, RN 10:23 AM

## 2022-05-22 NOTE — Progress Notes (Addendum)
Lindsey Cunningham is a 45 y.o. female who presents to [facility] clinic today with no complaints.    Pap Smear: Pap smear not completed today. Last Pap smear was 02/25/2021 at the free cervical cancer screening clinic and was normal with negative HPV. Per patient has no history of an abnormal Pap smear. Patient has a history of a hysterectomy 05/14/2022 due to fibroids and menorrhagia. Patient doesn't need any further Pap smears due to her history of a hysterectomy for benign reasons per BCCCP and ASCCP guidelines. Last Pap smear result is available in Epic.  Physical exam: Breasts Breasts symmetrical. No skin abnormalities bilateral breasts. No nipple retraction bilateral breasts. No nipple discharge bilateral breasts. No lymphadenopathy. No lumps palpated bilateral breasts. No complaints of pain or tenderness on exam.    MS DIGITAL SCREENING TOMO BILATERAL  Result Date: 05/03/2021 CLINICAL DATA:  Screening. EXAM: DIGITAL SCREENING BILATERAL MAMMOGRAM WITH TOMOSYNTHESIS AND CAD TECHNIQUE: Bilateral screening digital craniocaudal and mediolateral oblique mammograms were obtained. Bilateral screening digital breast tomosynthesis was performed. The images were evaluated with computer-aided detection. COMPARISON:  None. ACR Breast Density Category b: There are scattered areas of fibroglandular density. FINDINGS: There are no findings suspicious for malignancy. The images were evaluated with computer-aided detection. IMPRESSION: No mammographic evidence of malignancy. A result letter of this screening mammogram will be mailed directly to the patient. RECOMMENDATION: Screening mammogram in one year. (Code:SM-B-01Y) BI-RADS CATEGORY  1: Negative. Electronically Signed   By: Marin Olp M.D.   On: 05/03/2021 10:54     Pelvic/Bimanual Pap is not indicated today per BCCCP guidelines.  Smoking History: Patient has never smoked.   Patient Navigation: Patient education provided. Access to services provided for  patient through [facility] program. Arabic interpreter Harriet Pho from [facility] provided.   Breast and Cervical Cancer Risk Assessment: Patient does not have family history of breast cancer, known genetic mutations, or radiation treatment to the chest before age 52. Patient does not have history of cervical dysplasia, immunocompromised, or DES exposure in-utero.  Risk Assessment     Risk Scores       05/22/2022 05/02/2021   Last edited by: Demetrius Revel, LPN Royston Bake, CMA   5-year risk: 0.9 % 0.8 %   Lifetime risk: 10.7 % 10.8 %            A: BCCCP exam without pap smear No complaints.  P: Referred patient to the [facility] for a screening mammogram on the mobile unit. Appointment scheduled Thursday, May 22, 2022 at 1100.  Loletta Parish, RN 05/22/2022 10:23 AM

## 2022-05-29 ENCOUNTER — Ambulatory Visit: Payer: No Typology Code available for payment source | Admitting: Obstetrics and Gynecology

## 2022-06-19 ENCOUNTER — Other Ambulatory Visit: Payer: Self-pay

## 2022-06-19 ENCOUNTER — Encounter: Payer: Self-pay | Admitting: Internal Medicine

## 2022-06-19 ENCOUNTER — Ambulatory Visit (INDEPENDENT_AMBULATORY_CARE_PROVIDER_SITE_OTHER): Payer: Self-pay | Admitting: Internal Medicine

## 2022-06-19 VITALS — BP 127/74 | HR 67 | Temp 97.8°F | Ht 66.0 in | Wt 248.6 lb

## 2022-06-19 DIAGNOSIS — Z23 Encounter for immunization: Secondary | ICD-10-CM

## 2022-06-19 DIAGNOSIS — Z0000 Encounter for general adult medical examination without abnormal findings: Secondary | ICD-10-CM

## 2022-06-19 DIAGNOSIS — I10 Essential (primary) hypertension: Secondary | ICD-10-CM

## 2022-06-19 NOTE — Progress Notes (Signed)
   CC: 3 month follow-up  HPI:  [name] is a 46 y.o. with past medical history as noted below who presents to the clinic today for follow-up. Please see problem-based list for further details, assessments, and plans.   Past Medical History:  Diagnosis Date   Chest pain 07/10/2019   CP w/ SOB. See 07/10/19 ED visit in [place]. Per MD note, VS reassuring, EKG shows no ischemia or arrhythmia. Cxray shows no evidence of pneumothorax or pneumonia. D- dimer negative. Troponins negative x 2. Patient discharged home.   History of prediabetes    04/25/21 HgbA1C 5.8, 03/19/22 HgbA1C 5.3   HLD (hyperlipidemia)    HTN (hypertension)    see 03/19/22 OV note from Dr. Jose Persia in [place]. Patient had multiple elevated BP readings and anti-hypertensives were recommended. Patient wants to try lifestyle modifications.   Irregular periods/menstrual cycles    Review of Systems: Negative aside from that listed in individualized problem based charting.   Physical Exam:  Vitals:   06/19/22 0846  BP: 127/74  Pulse: 67  Temp: 97.8 F (36.6 C)  TempSrc: Oral  SpO2: 100%  Weight: 248 lb 9.6 oz (112.8 kg)  Height: '5\' 6"'$  (1.676 m)   General: NAD, nl appearance HE: Normocephalic, atraumatic, EOMI, Conjunctivae normal ENT: No congestion, no rhinorrhea, no exudate or erythema  Cardiovascular: Normal rate, regular rhythm. No murmurs, rubs, or gallops Pulmonary: Effort normal, breath sounds normal. No wheezes, rales, or rhonchi Abdominal: soft, nontender, bowel sounds present Musculoskeletal: no swelling, deformity, injury or tenderness in extremities Skin: Warm, dry, no bruising, erythema, or rash Psychiatric/Behavioral: normal mood, normal behavior     Assessment & Plan:   See Encounters Tab for problem based charting.  Patient discussed with Dr. Dareen Piano

## 2022-06-19 NOTE — Patient Instructions (Signed)
Thank you, [name] for allowing [place] to provide your care today. Today we discussed your blood pressure.  Your blood pressures look great today! We will see you in 6 months to repeat your blood pressures.  Thank you for getting your tetanus shot today!    I have ordered the following labs for you:  Lab Orders  No laboratory test(s) ordered today     Referrals ordered today:   Referral Orders  No referral(s) requested today     I have ordered the following medication/changed the following medications:   Stop the following medications: There are no discontinued medications.   Start the following medications: No orders of the defined types were placed in this encounter.    Follow up: 6 months    Should you have any questions or concerns please call the internal medicine clinic at [phone].

## 2022-06-19 NOTE — Assessment & Plan Note (Signed)
BP Readings from Last 3 Encounters:  06/19/22 127/74  05/22/22 140/80  05/15/22 112/74    Patient is here today for a BP check. She has been controlling her BP with lifestyle modifications.  Plan: Follow-up in 6 months.

## 2022-06-19 NOTE — Assessment & Plan Note (Signed)
Tdap today

## 2022-06-25 ENCOUNTER — Other Ambulatory Visit: Payer: Self-pay

## 2022-06-25 ENCOUNTER — Ambulatory Visit (INDEPENDENT_AMBULATORY_CARE_PROVIDER_SITE_OTHER): Payer: Self-pay | Admitting: Obstetrics and Gynecology

## 2022-06-25 ENCOUNTER — Encounter: Payer: Self-pay | Admitting: Obstetrics and Gynecology

## 2022-06-25 VITALS — BP 140/87 | HR 73 | Ht 66.0 in | Wt 246.0 lb

## 2022-06-25 DIAGNOSIS — Z9079 Acquired absence of other genital organ(s): Secondary | ICD-10-CM | POA: Insufficient documentation

## 2022-06-25 DIAGNOSIS — Z90710 Acquired absence of both cervix and uterus: Secondary | ICD-10-CM | POA: Insufficient documentation

## 2022-06-25 DIAGNOSIS — Z9889 Other specified postprocedural states: Secondary | ICD-10-CM

## 2022-06-25 NOTE — Progress Notes (Signed)
Ms Scantlin presents for post op appt. S/P TVH with BS Pathology reviewed Doing well Denies any bowel or bladder dysfunction  PE AF VSS Lungs clear Heart RRR Abd soft + BS GU Nl EGBUS, cuff healing well  A/P Post Op  Doing well. Return to nl ADL's as tolerates. F/U in yr or PRN

## 2022-06-25 NOTE — Patient Instructions (Signed)

## 2022-12-30 ENCOUNTER — Encounter: Payer: No Typology Code available for payment source | Admitting: Student

## 2023-06-29 IMAGING — US US PELVIS COMPLETE
1 series · 14 of 25 positions shown · non-contrast
Comparison: Prior ultrasound from 05/23/2010.

CLINICAL DATA: Initial evaluation for abnormal uterine bleeding.

EXAM:
TRANSABDOMINAL ULTRASOUND OF PELVIS
TECHNIQUE: Transabdominal ultrasound examination of the pelvis was performed
including evaluation of the uterus, ovaries, adnexal regions, and
pelvic cul-de-sac.

[Series 1: us pelvis complete · 72 acquisitions, 14 frames shown]
[im 1/72]
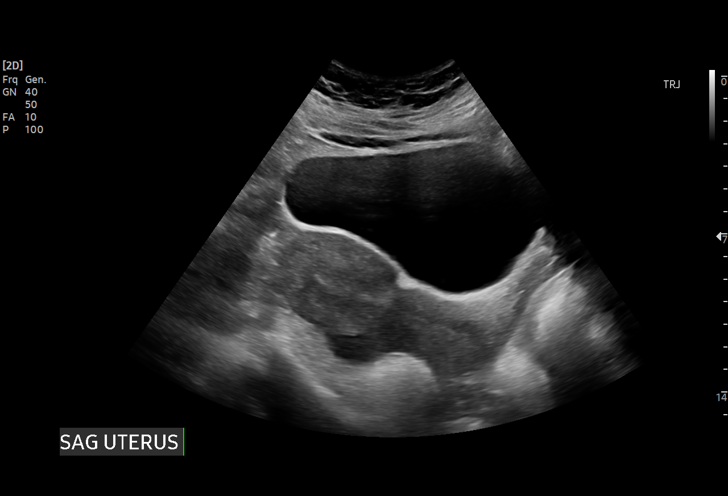
[im 6/72]
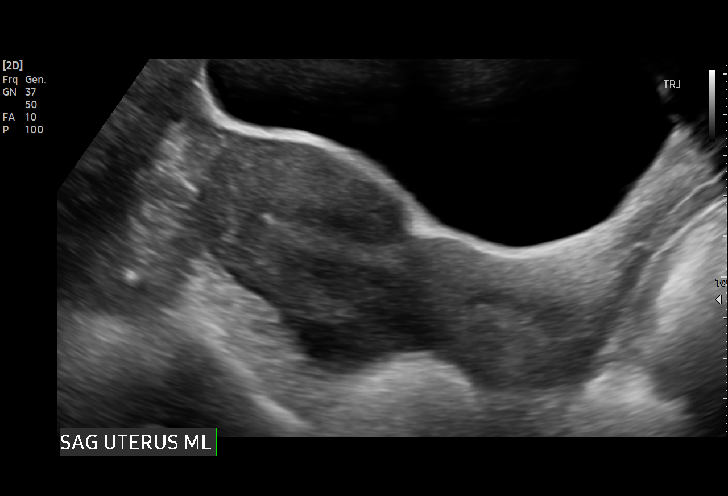
[im 12/72]
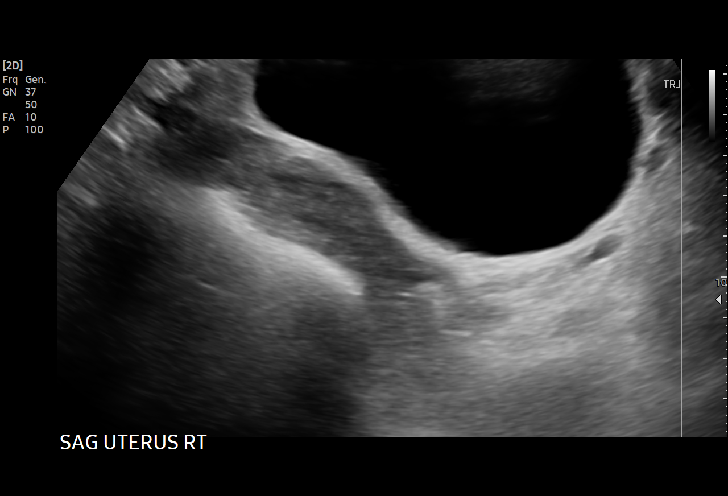
[im 18/72]
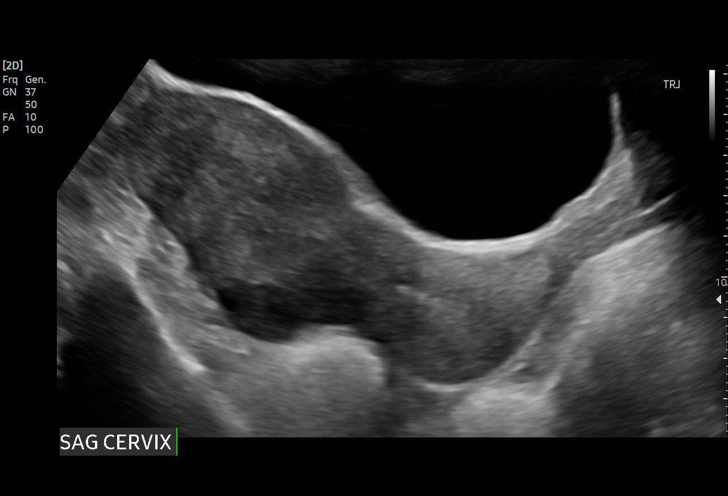
[im 24/72]
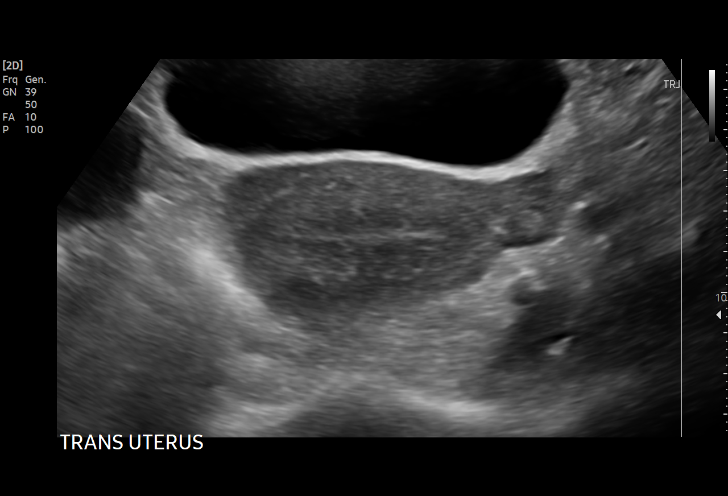
[im 27/72]
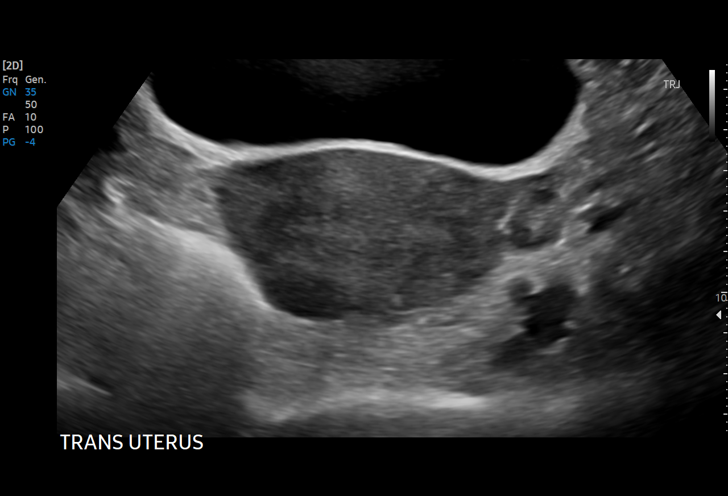
[im 33/72]
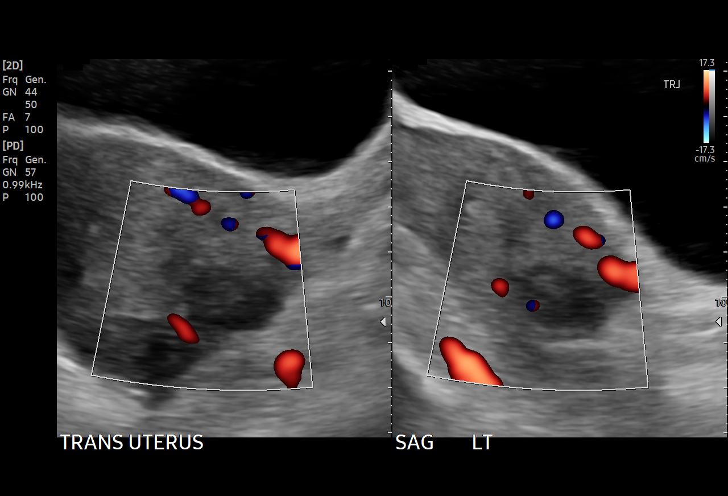
[im 39/72]
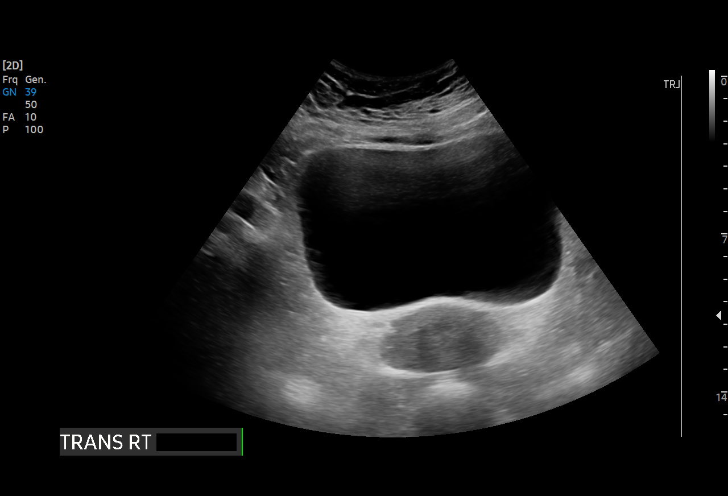
[im 45/72]
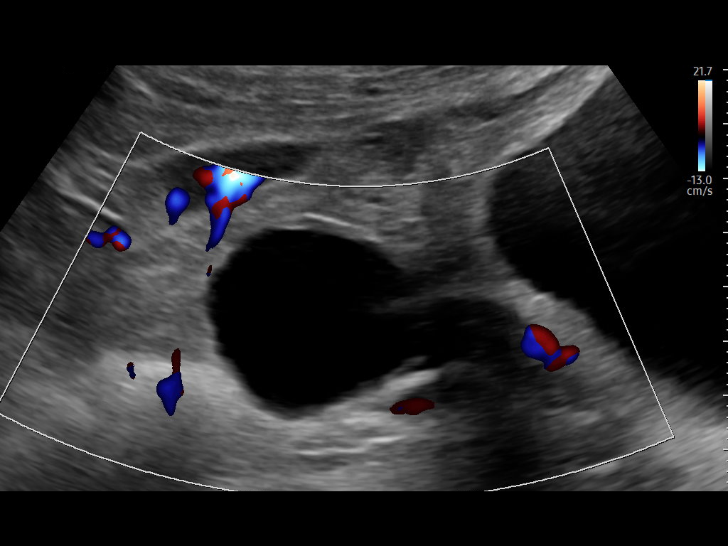
[im 48/72]
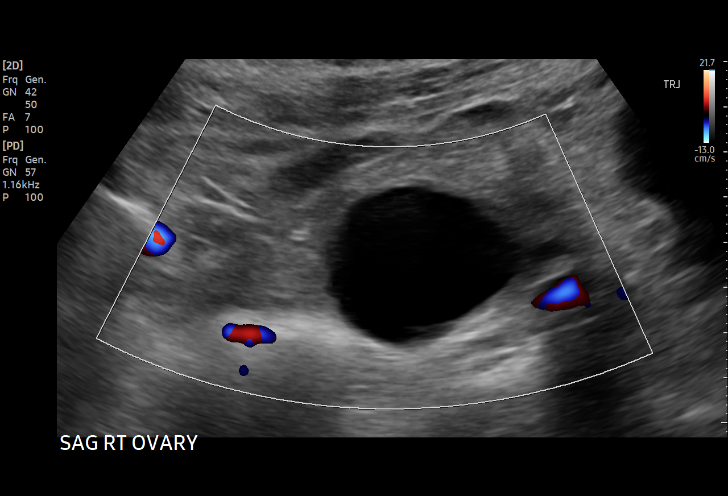
[im 54/72]
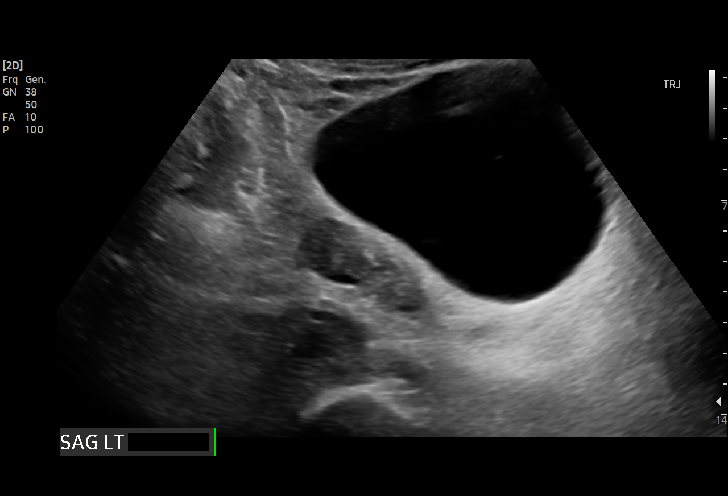
[im 60/72]
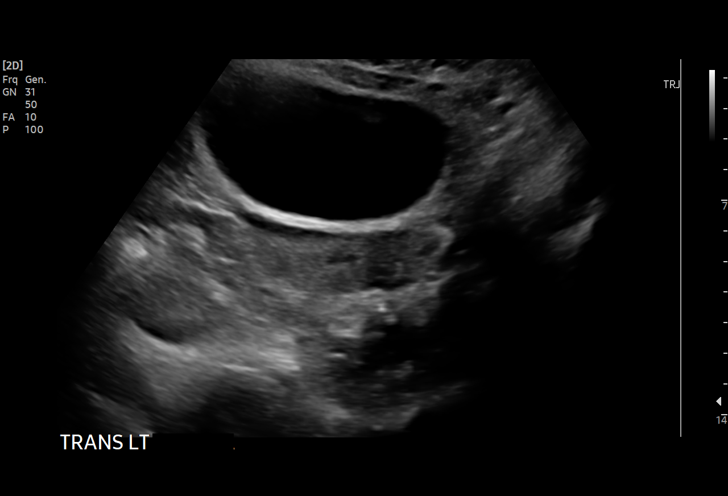
[im 66/72]
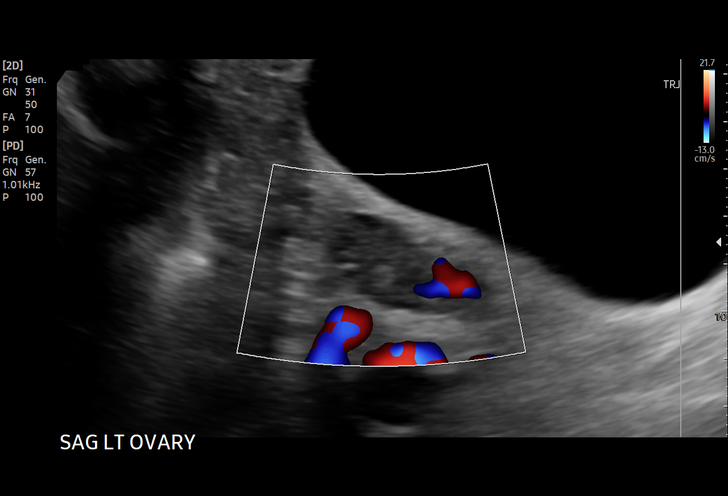
[im 72/72]
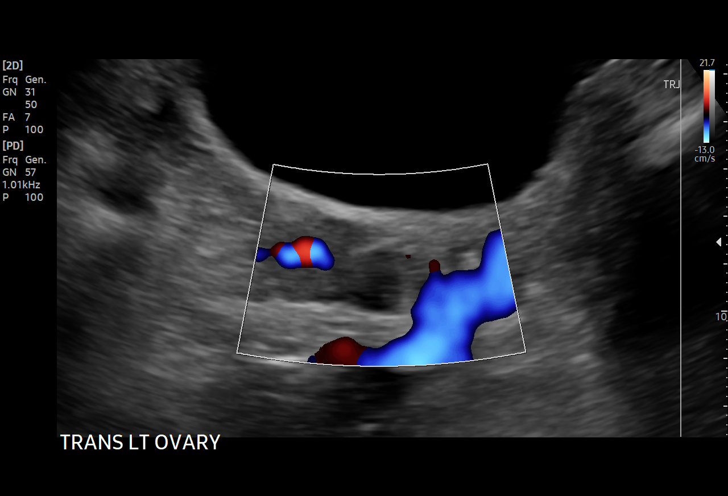

[14 of 25 positions shown; findings below may reference images not displayed]

FINDINGS: Uterus

Measurements: 10.3 x 4.2 x 6.9 cm = volume: 154.2 mL. Uterus is
anteverted. 2.0 x 1.5 x 2.6 cm subserosal fibroid present at the mid
posterior uterine body. 1.8 x 1.1 x 1.9 cm subserosal fibroid
present at the right posterior uterine body. 2.1 x 1.4 x 2.2 cm
subserosal fibroid present at the left posterior uterine body.

Endometrium

Thickness: 5.7 mm.  No focal abnormality visualized.

Right ovary

Measurements: 5.9 x 3.9 x 2.9 cm = volume: 35.0 mL. 4.1 x 3.6 x
cm simple cyst. No significant internal complexity. No vascularity
or solid component.

Left ovary

Measurements: 2.9 x 1.6 x 1.9 cm = volume: 4.7 mL. Normal
appearance/no adnexal mass.

Other findings:  No abnormal free fluid.
IMPRESSION: 1. Endometrial stripe measures 5.7 mm in thickness. If bleeding
remains unresponsive to hormonal or medical therapy, sonohysterogram
should be considered for focal lesion work-up. (Ref: Radiological
Reasoning: Algorithmic Workup of Abnormal Vaginal Bleeding with
Endovaginal Sonography and Sonohysterography. AJR [id number]; 191:S68-73).
2. Underlying fibroid uterus as detailed above.
3. 4.1 cm simple right ovarian cyst, benign in appearance. No follow
up imaging recommended. Note: This recommendation does not apply to
premenarchal patients or to those with increased risk (genetic,
family history, elevated tumor markers or other high-risk factors)
of ovarian cancer. Reference: Radiology [DATE]):[id number].
4. Normal left ovary.  No other adnexal mass or free fluid.
